# Patient Record
Sex: Female | Born: 1950 | Race: White | Hispanic: No | Marital: Married | Smoking: Never smoker
Health system: Southern US, Community
[De-identification: ages and names within clinical notes are randomized; demographics above are authoritative.]

## PROBLEM LIST (undated history)

## (undated) DIAGNOSIS — I10 Essential (primary) hypertension: Secondary | ICD-10-CM

## (undated) DIAGNOSIS — E785 Hyperlipidemia, unspecified: Secondary | ICD-10-CM

## (undated) HISTORY — DX: Hyperlipidemia, unspecified: E78.5

## (undated) HISTORY — DX: Essential (primary) hypertension: I10

## (undated) HISTORY — PX: TUBAL LIGATION: SHX77

---
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
## 2015-09-12 ENCOUNTER — Encounter: Payer: Self-pay | Admitting: Emergency Medicine

## 2015-09-12 ENCOUNTER — Emergency Department
Admission: EM | Admit: 2015-09-12 | Discharge: 2015-09-12 | Disposition: A | Discharge status: Home / Self Care | Payer: Self-pay | Source: Home / Self Care | Attending: Family Medicine | Admitting: Family Medicine

## 2015-09-12 DIAGNOSIS — I10 Essential (primary) hypertension: Secondary | ICD-10-CM

## 2015-09-12 DIAGNOSIS — R002 Palpitations: Secondary | ICD-10-CM

## 2015-09-12 DIAGNOSIS — M940 Chondrocostal junction syndrome [Tietze]: Secondary | ICD-10-CM

## 2015-09-12 NOTE — Discharge Instructions (Signed)
Apply ice pack for 20 to 30 minutes, 3 to 4 times daily  Continue until pain decreases.  May begin Ibuprofen 200mg , 4 tabs every 8 hours with food.   Continue carvedilol.  Monitor blood pressure and record on a calendar.  If cold symptoms develop, try the following: Take plain guaifenesin (1200mg  extended release tabs such as Mucinex) twice daily, with plenty of water, for cough and congestion. Get adequate rest.   May use Afrin nasal spray (or generic oxymetazoline) twice daily for about 5 days and then discontinue.  Also recommend using saline nasal spray several times daily and saline nasal irrigation (AYR is a common brand).  Use Flonase nasal spray each morning after using Afrin nasal spray and saline nasal irrigation. Try warm salt water gargles for sore throat.  Stop all antihistamines for now, and other non-prescription cough/cold preparations. May take Delsym Cough Suppressant at bedtime for nighttime cough.  Follow-up with family doctor if not improving about10 days.    Costochondritis Costochondritis, sometimes called Tietze syndrome, is a swelling and irritation (inflammation) of the tissue (cartilage) that connects your ribs with your breastbone (sternum). It causes pain in the chest and rib area. Costochondritis usually goes away on its own over time. It can take up to 6 weeks or longer to get better, especially if you are unable to limit your activities. CAUSES  Some cases of costochondritis have no known cause. Possible causes include:  Injury (trauma).  Exercise or activity such as lifting.  Severe coughing. SIGNS AND SYMPTOMS  Pain and tenderness in the chest and rib area.  Pain that gets worse when coughing or taking deep breaths.  Pain that gets worse with specific movements. DIAGNOSIS  Your health care provider will do a physical exam and ask about your symptoms. Chest X-rays or other tests may be done to rule out other problems. TREATMENT  Costochondritis  usually goes away on its own over time. Your health care provider may prescribe medicine to help relieve pain. HOME CARE INSTRUCTIONS   Avoid exhausting physical activity. Try not to strain your ribs during normal activity. This would include any activities using chest, abdominal, and side muscles, especially if heavy weights are used.  Apply ice to the affected area for the first 2 days after the pain begins.  Put ice in a plastic bag.  Place a towel between your skin and the bag.  Leave the ice on for 20 minutes, 2-3 times a day.  Only take over-the-counter or prescription medicines as directed by your health care provider. SEEK MEDICAL CARE IF:  You have redness or swelling at the rib joints. These are signs of infection.  Your pain does not go away despite rest or medicine. SEEK IMMEDIATE MEDICAL CARE IF:   Your pain increases or you are very uncomfortable.  You have shortness of breath or difficulty breathing.  You cough up blood.  You have worse chest pains, sweating, or vomiting.  You have a fever or persistent symptoms for more than 2-3 days.  You have a fever and your symptoms suddenly get worse. MAKE SURE YOU:   Understand these instructions.  Will watch your condition.  Will get help right away if you are not doing well or get worse.   This information is not intended to replace advice given to you by your health care provider. Make sure you discuss any questions you have with your health care provider.   Document Released: 04/07/2005 Document Revised: 04/18/2013 Document Reviewed: 01/30/2013 Elsevier  Interactive Patient Education ©2016 Elsevier Inc. ° °

## 2015-09-12 NOTE — ED Notes (Signed)
Chest tightness started this morning, no pain, has had palpitations for years was evaluated earlier in the week at another Urgent Care and prescribed Carvedilol

## 2015-09-12 NOTE — ED Provider Notes (Signed)
CSN: [id number]     Arrival date & time 09/12/15  [id number] History   First MD Initiated Contact with Patient 09/12/15 0913     Chief Complaint  Patient presents with  . Chest Pain      HPI Comments: Two to three days ago patient had not felt well and found her blood pressure to be elevated.  She has also had palpitations in the past and noted her pulse to be elevated.  She visited a local [facility] urgent care center where she was found to have an elevated blood pressure of 164/92 and pulse of 102.  Her EKG showed an incomplete right bundle branch block without other acute changes.  She was started on carvedilol 12.5mg  BID.  The patient complains of onset of anterior chest tightness this morning, and was very concerned because she did not quite understand what a RBBB is.  She notes that her blood pressure improved after starting carvedilol. Patient reports that she has developed a cough during the past two days, with mild sore throat, nasal congestion, fatigue, and chills. 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 Family history:  Father died of MI age 89.  Mother with hypertension and ASCVD (stent placed).  The history is provided by the patient.    For Past Medical History, Surgical History, and Family History, see HPI Comments. Social History  Substance Use Topics  . Smoking status: Never Smoker   . Smokeless tobacco: None  . Alcohol Use: No   OB History    No data available     Review of Systems + sore throat + cough No pleuritic pain, but has pain over substernal area No wheezing + nasal congestion ? post-nasal drainage No sinus pain/pressure No itchy/red eyes No earache No hemoptysis No SOB No fever, + chills No nausea No vomiting No abdominal pain No diarrhea No urinary symptoms No skin rash + fatigue No myalgias No headache Used OTC meds without relief  Allergies  Review of patient's allergies indicates no known allergies.  Home  Medications   Prior to Admission medications   Medication Sig Start Date End Date Taking? Authorizing Provider  aspirin 81 MG tablet Take 81 mg by mouth daily.   Yes Historical Provider, MD  carvedilol (COREG) 12.5 MG tablet Take 12.5 mg by mouth 2 (two) times daily with a meal.   Yes Historical Provider, MD   Meds Ordered and Administered this Visit  Medications - No data to display  BP 141/80 mmHg  Pulse 74  Temp(Src) 97.7 F (36.5 C) (Oral)  Ht 5\' 1"  (1.549 m)  Wt 133 lb (60.328 kg)  BMI 25.14 kg/m2  SpO2 100% No data found.   Physical Exam Nursing notes and Vital Signs reviewed. Appearance:  Patient appears stated age, and in no acute distress Eyes:  Pupils are equal, round, and reactive to light and accomodation.  Extraocular movement is intact.  Conjunctivae are not inflamed  Ears:  Canals normal.  Tympanic membranes normal.  Nose:  Mildly congested turbinates.  No sinus tenderness.   Pharynx:  Normal Neck:  Supple.  Tender enlarged posterior nodes are palpated bilaterally  Lungs:  Clear to auscultation.  Breath sounds are equal.  Moving air well. Chest:  Distinct tenderness to palpation over the mid-sternum; palpation there recreates her chest pain Heart:  Regular rate and rhythm without murmurs, rubs, or gallops.  Abdomen:  Nontender without masses or hepatosplenomegaly.  Bowel sounds are present.  No CVA  or flank tenderness.  Extremities:  No edema.  Skin:  No rash present.    ED Course  Procedures none    Labs Reviewed -   EKG: Rate:  70 BPM PR:  0.12 msec QT:  0.36 msec QTcH:   msec QRSD:  0.10 msec QRS axis:  +10  degrees Interpretation:   Normal sinus rhythm, incomplete RBBB.  No acute changes. Review of EKD from [facility] dated 09/10/15 reveals no significant changes from today's tracing.    MDM   1. Costochondritis; suspect a consequence of early viral URI  2. Palpitations   3. Essential hypertension, benign    Reassurance Apply ice pack to sternum  for 20 to 30 minutes, 3 to 4 times daily  Continue until pain decreases.  May begin Ibuprofen 200mg , 4 tabs every 8 hours with food.   Continue carvedilol.  Monitor blood pressure and record on a calendar.  If cold symptoms develop, try the following: Take plain guaifenesin (1200mg  extended release tabs such as Mucinex) twice daily, with plenty of water, for cough and congestion. Get adequate rest.   May use Afrin nasal spray (or generic oxymetazoline) twice daily for about 5 days and then discontinue.  Also recommend using saline nasal spray several times daily and saline nasal irrigation (AYR is a common brand).  Use Flonase nasal spray each morning after using Afrin nasal spray and saline nasal irrigation. Try warm salt water gargles for sore throat.  Stop all antihistamines for now, and other non-prescription cough/cold preparations. May take Delsym Cough Suppressant at bedtime for nighttime cough.  Follow-up with family doctor if not improving about10 days.   Recommend follow-up with PCP to establish care and evaluate/treat palpitations and hypertension     Kandra Nicolas, MD 09/15/15 2214

## 2015-09-24 ENCOUNTER — Encounter: Payer: Self-pay | Admitting: Physician Assistant

## 2015-09-24 ENCOUNTER — Ambulatory Visit (INDEPENDENT_AMBULATORY_CARE_PROVIDER_SITE_OTHER): Payer: Self-pay | Admitting: Physician Assistant

## 2015-09-24 VITALS — BP 152/98 | HR 89 | Ht 61.5 in | Wt 132.0 lb

## 2015-09-24 DIAGNOSIS — I4510 Unspecified right bundle-branch block: Secondary | ICD-10-CM | POA: Insufficient documentation

## 2015-09-24 DIAGNOSIS — Z13220 Encounter for screening for lipoid disorders: Secondary | ICD-10-CM

## 2015-09-24 DIAGNOSIS — I10 Essential (primary) hypertension: Secondary | ICD-10-CM | POA: Insufficient documentation

## 2015-09-24 DIAGNOSIS — Z131 Encounter for screening for diabetes mellitus: Secondary | ICD-10-CM

## 2015-09-24 DIAGNOSIS — R002 Palpitations: Secondary | ICD-10-CM | POA: Insufficient documentation

## 2015-09-24 DIAGNOSIS — C434 Malignant melanoma of scalp and neck: Secondary | ICD-10-CM | POA: Insufficient documentation

## 2015-09-24 DIAGNOSIS — I450 Right fascicular block: Secondary | ICD-10-CM

## 2015-09-24 LAB — COMPLETE METABOLIC PANEL WITH GFR
ALK PHOS: 73 U/L (ref 33–130)
ALT: 17 U/L (ref 6–29)
AST: 17 U/L (ref 10–35)
Albumin: 4.6 g/dL (ref 3.6–5.1)
BILIRUBIN TOTAL: 0.6 mg/dL (ref 0.2–1.2)
BUN: 7 mg/dL (ref 7–25)
CALCIUM: 9.8 mg/dL (ref 8.6–10.4)
CHLORIDE: 100 mmol/L (ref 98–110)
CO2: 26 mmol/L (ref 20–31)
CREATININE: 0.84 mg/dL (ref 0.50–0.99)
GFR, EST AFRICAN AMERICAN: 86 mL/min (ref >=60)
GFR, Est Non African American: 74 mL/min (ref >=60)
Glucose, Bld: 84 mg/dL (ref 65–99)
Potassium: 3.8 mmol/L (ref 3.5–5.3)
Sodium: 138 mmol/L (ref 135–146)
Total Protein: 6.9 g/dL (ref 6.1–8.1)

## 2015-09-24 LAB — LIPID PANEL
CHOLESTEROL: 214 mg/dL — AB (ref 125–200)
HDL: 54 mg/dL (ref >=46)
LDL Cholesterol: 144 mg/dL — ABNORMAL HIGH (ref <130)
TRIGLYCERIDES: 78 mg/dL (ref <150)
Total CHOL/HDL Ratio: 4 Ratio (ref <=5.0)
VLDL: 16 mg/dL (ref <30)

## 2015-09-24 LAB — CBC WITH DIFFERENTIAL/PLATELET
BASOS PCT: 1 % (ref 0–1)
Basophils Absolute: 0.1 10*3/uL (ref 0.0–0.1)
EOS ABS: 0.1 10*3/uL (ref 0.0–0.7)
EOS PCT: 1 % (ref 0–5)
HCT: 42.9 % (ref 36.0–46.0)
Hemoglobin: 15.5 g/dL — ABNORMAL HIGH (ref 12.0–15.0)
Lymphocytes Relative: 34 % (ref 12–46)
Lymphs Abs: 2.1 10*3/uL (ref 0.7–4.0)
MCH: 32.2 pg (ref 26.0–34.0)
MCHC: 36.1 g/dL — AB (ref 30.0–36.0)
MCV: 89.2 fL (ref 78.0–100.0)
MONO ABS: 0.6 10*3/uL (ref 0.1–1.0)
MPV: 11.8 fL (ref 8.6–12.4)
Monocytes Relative: 9 % (ref 3–12)
NEUTROS ABS: 3.5 10*3/uL (ref 1.7–7.7)
Neutrophils Relative %: 55 % (ref 43–77)
Platelets: 244 10*3/uL (ref 150–400)
RBC: 4.81 MIL/uL (ref 3.87–5.11)
RDW: 13.5 % (ref 11.5–15.5)
WBC: 6.3 10*3/uL (ref 4.0–10.5)

## 2015-09-24 LAB — TSH: TSH: 1.55 m[IU]/L

## 2015-09-24 LAB — VITAMIN B12: Vitamin B-12: 1564 pg/mL — ABNORMAL HIGH (ref 200–1100)

## 2015-09-24 LAB — FERRITIN: FERRITIN: 108 ng/mL (ref 20–288)

## 2015-09-24 MED ORDER — NEBIVOLOL HCL 5 MG PO TABS: 5.0000 mg | ORAL_TABLET | Freq: Every day | ORAL | Status: DC

## 2015-09-24 NOTE — Progress Notes (Signed)
   Subjective:    Patient ID: Theresa Herrera, female    DOB: 09-Dec-1950, 65 y.o.   MRN: [id number]  HPI Patient is 65 year old female who presents to the clinic to establish care.  .. Active Ambulatory Problems    Diagnosis Date Noted  . 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 Essential hypertension, benign 09/24/2015  . Palpitations 09/24/2015  . Incomplete RBBB 09/24/2015   Resolved Ambulatory Problems    Diagnosis Date Noted  . No Resolved Ambulatory Problems   Past Medical History  Diagnosis Date  . Hyperlipidemia   . Hypertension    .Marland Kitchen Family History  Problem Relation Age of Onset  . Cancer Mother   . Hyperlipidemia Mother   . Hypertension Mother   . Hypertension Father   . Hyperlipidemia Father   . Heart attack Father    .Marland Kitchen Social History   Social History  . Marital Status: Married    Spouse Name: N/A  . Number of Children: N/A  . Years of Education: N/A   Occupational History  . Not on file.   Social History Main Topics  . Smoking status: Never Smoker   . Smokeless tobacco: Not on file  . Alcohol Use: No  . Drug Use: Not on file  . Sexual Activity: No   Other Topics Concern  . Not on file   Social History Narrative   patient's main concern today is her palpitations and blood pressure. She has been to 2 urgent cares in the last few months due to chest pain. She was diagnosed with incomplete right bundle branch block and costochondritis. She does admit she's feeling much better. Her palpitations have improved significantly. She had been taking Coreg twice a day. She does not like the way this makes her feel. She feels very "drugged". She did stop it 2 days ago. She has not had any fasting labs done in a while.  Patient would like a referral for dermatology. She had melanoma of her neck in 2006. She gets yearly skin checks. She would like to go with someone to [facility].  Review of Systems  All other systems reviewed and are negative.      Objective:    Physical Exam  Constitutional: She appears well-developed and well-nourished.  HENT:  Head: Normocephalic and atraumatic.  Neck: Normal range of motion. Neck supple. No thyromegaly present.  Cardiovascular: Normal rate, regular rhythm and normal heart sounds.   Pulmonary/Chest: Effort normal and breath sounds normal. She has no wheezes.  Skin: Skin is dry.  Psychiatric: She has a normal mood and affect. Her behavior is normal.          Assessment & Plan:  Palpitations/HTN- palpitations improving. Elevated BP could be due to rebound hypertension from stopping BB.start ASA 81mg . Stop carvediol. Start bystolic 5mg  daily. Follow up in 1 month. Labs to check TSH, CBC, ferritin for causes of palpitations.   Fasting lipid and glucose ordered.    RBBB- reassured patient. Will continue to monitor and asses risk factors.   Melanoma on neck, resolved-  Removed 06/24/2005. will make referral for dermatology for skin checks.

## 2015-09-24 NOTE — Patient Instructions (Addendum)
Start ASA 81mg  daily.   140/90 goal.   caltrate-D  DASH Eating Plan DASH stands for "Dietary Approaches to Stop Hypertension." The DASH eating plan is a healthy eating plan that has been shown to reduce high blood pressure (hypertension). Additional health benefits may include reducing the risk of type 2 diabetes mellitus, heart disease, and stroke. The DASH eating plan may also help with weight loss. WHAT DO I NEED TO KNOW ABOUT THE DASH EATING PLAN? For the DASH eating plan, you will follow these general guidelines:  Choose foods with a percent daily value for sodium of less than 5% (as listed on the food label).  Use salt-free seasonings or herbs instead of table salt or sea salt.  Check with your health care provider or pharmacist before using salt substitutes.  Eat lower-sodium products, often labeled as "lower sodium" or "no salt added."  Eat fresh foods.  Eat more vegetables, fruits, and low-fat dairy products.  Choose whole grains. Look for the word "whole" as the first word in the ingredient list.  Choose fish and skinless chicken or [place] more often than red meat. Limit fish, poultry, and meat to 6 oz (170 g) each day.  Limit sweets, desserts, sugars, and sugary drinks.  Choose heart-healthy fats.  Limit cheese to 1 oz (28 g) per day.  Eat more home-cooked food and less restaurant, buffet, and fast food.  Limit fried foods.  Cook foods using methods other than frying.  Limit canned vegetables. If you do use them, rinse them well to decrease the sodium.  When eating at a restaurant, ask that your food be prepared with less salt, or no salt if possible. WHAT FOODS CAN I EAT? Seek help from a dietitian for individual calorie needs. Grains Whole grain or whole wheat bread. Brown rice. Whole grain or whole wheat pasta. Quinoa, bulgur, and whole grain cereals. Low-sodium cereals. Corn or whole wheat flour tortillas. Whole grain cornbread. Whole grain crackers.  Low-sodium crackers. Vegetables Fresh or frozen vegetables (raw, steamed, roasted, or grilled). Low-sodium or reduced-sodium tomato and vegetable juices. Low-sodium or reduced-sodium tomato sauce and paste. Low-sodium or reduced-sodium canned vegetables.  Fruits All fresh, canned (in natural juice), or frozen fruits. Meat and Other Protein Products Ground beef (85% or leaner), grass-fed beef, or beef trimmed of fat. Skinless chicken or [place]. Ground chicken or [place]. Pork trimmed of fat. All fish and seafood. Eggs. Dried beans, peas, or lentils. Unsalted nuts and seeds. Unsalted canned beans. Dairy Low-fat dairy products, such as skim or 1% milk, 2% or reduced-fat cheeses, low-fat ricotta or cottage cheese, or plain low-fat yogurt. Low-sodium or reduced-sodium cheeses. Fats and Oils Tub margarines without trans fats. Light or reduced-fat mayonnaise and salad dressings (reduced sodium). Avocado. Safflower, olive, or canola oils. Natural peanut or almond butter. Other Unsalted popcorn and pretzels. The items listed above may not be a complete list of recommended foods or beverages. Contact your dietitian for more options. WHAT FOODS ARE NOT RECOMMENDED? Grains White bread. White pasta. White rice. Refined cornbread. Bagels and croissants. Crackers that contain trans fat. Vegetables Creamed or fried vegetables. Vegetables in a cheese sauce. Regular canned vegetables. Regular canned tomato sauce and paste. Regular tomato and vegetable juices. Fruits Dried fruits. Canned fruit in light or heavy syrup. Fruit juice. Meat and Other Protein Products Fatty cuts of meat. Ribs, chicken wings, bacon, sausage, bologna, salami, chitterlings, fatback, hot dogs, bratwurst, and packaged luncheon meats. Salted nuts and seeds. Canned beans with salt. Dairy Whole or  2% milk, cream, half-and-half, and cream cheese. Whole-fat or sweetened yogurt. Full-fat cheeses or blue cheese. Nondairy creamers and whipped  toppings. Processed cheese, cheese spreads, or cheese curds. Condiments Onion and garlic salt, seasoned salt, table salt, and sea salt. Canned and packaged gravies. Worcestershire sauce. Tartar sauce. Barbecue sauce. Teriyaki sauce. Soy sauce, including reduced sodium. Steak sauce. Fish sauce. Oyster sauce. Cocktail sauce. Horseradish. Ketchup and mustard. Meat flavorings and tenderizers. Bouillon cubes. Hot sauce. Tabasco sauce. Marinades. Taco seasonings. Relishes. Fats and Oils Butter, stick margarine, lard, shortening, ghee, and bacon fat. Coconut, palm kernel, or palm oils. Regular salad dressings. Other Pickles and olives. Salted popcorn and pretzels. The items listed above may not be a complete list of foods and beverages to avoid. Contact your dietitian for more information. WHERE CAN I FIND MORE INFORMATION? National Heart, Lung, and Blood Institute: travelstabloid.com   This information is not intended to replace advice given to you by your health care provider. Make sure you discuss any questions you have with your health care provider.   Document Released: 06/17/2011 Document Revised: 07/19/2014 Document Reviewed: 05/02/2013 Elsevier Interactive Patient Education [employer].

## 2015-09-25 ENCOUNTER — Telehealth: Payer: Self-pay | Admitting: *Deleted

## 2015-09-25 LAB — VITAMIN D 25 HYDROXY (VIT D DEFICIENCY, FRACTURES): VIT D 25 HYDROXY: 35 ng/mL (ref 30–100)

## 2015-09-25 NOTE — Telephone Encounter (Signed)
Pt left vm today stating that the Bystolic is [id number] for [id number] tabs.  She wants to know if there is something else or should she continue the carvedilol? Also, she said that she will keep her current dermatologist.

## 2015-09-26 ENCOUNTER — Encounter: Payer: Self-pay | Admitting: Physician Assistant

## 2015-09-26 DIAGNOSIS — R748 Abnormal levels of other serum enzymes: Secondary | ICD-10-CM | POA: Insufficient documentation

## 2015-09-26 DIAGNOSIS — E785 Hyperlipidemia, unspecified: Secondary | ICD-10-CM | POA: Insufficient documentation

## 2015-09-26 NOTE — Telephone Encounter (Signed)
Look for coupon for bystolic call pharmacy and see if will work with her plan. If not let me know.   Cancel derm referral per patient.

## 2015-09-29 ENCOUNTER — Other Ambulatory Visit: Payer: Self-pay | Admitting: Physician Assistant

## 2015-09-29 MED ORDER — METOPROLOL SUCCINATE ER 50 MG PO TB24: 50.0000 mg | ORAL_TABLET | Freq: Every day | ORAL | Status: DC

## 2015-09-29 NOTE — Telephone Encounter (Signed)
There are no Bystolic coupon cards.

## 2015-09-29 NOTE — Telephone Encounter (Signed)
I sent over another beta blocker metoprolol 50mg  daily. It should be cheaper. Once get this medication will stop carvedilol. i would not stop until make transition to new medication.

## 2015-09-29 NOTE — Telephone Encounter (Signed)
LMOM notifying pt of new rx & instructions.

## 2015-10-21 ENCOUNTER — Ambulatory Visit: Payer: Self-pay | Admitting: Physician Assistant

## 2015-11-25 ENCOUNTER — Telehealth: Payer: Self-pay | Admitting: *Deleted

## 2015-11-25 DIAGNOSIS — Z1159 Encounter for screening for other viral diseases: Secondary | ICD-10-CM

## 2015-11-25 NOTE — Telephone Encounter (Signed)
Pt called & requested a Hep C screen.  Ordered & faxed to lab.

## 2015-11-26 LAB — HEPATITIS C ANTIBODY: HCV AB: NEGATIVE

## 2017-07-26 DIAGNOSIS — R69 Illness, unspecified: Secondary | ICD-10-CM | POA: Diagnosis not present

## 2017-08-17 DIAGNOSIS — R69 Illness, unspecified: Secondary | ICD-10-CM | POA: Diagnosis not present

## 2017-09-14 DIAGNOSIS — Z7982 Long term (current) use of aspirin: Secondary | ICD-10-CM | POA: Diagnosis not present

## 2017-09-14 DIAGNOSIS — Z8249 Family history of ischemic heart disease and other diseases of the circulatory system: Secondary | ICD-10-CM | POA: Diagnosis not present

## 2017-09-14 DIAGNOSIS — Z809 Family history of malignant neoplasm, unspecified: Secondary | ICD-10-CM | POA: Diagnosis not present

## 2017-09-14 DIAGNOSIS — Z85828 Personal history of other malignant neoplasm of skin: Secondary | ICD-10-CM | POA: Diagnosis not present

## 2017-09-14 DIAGNOSIS — M8580 Other specified disorders of bone density and structure, unspecified site: Secondary | ICD-10-CM | POA: Diagnosis not present

## 2017-09-14 DIAGNOSIS — I10 Essential (primary) hypertension: Secondary | ICD-10-CM | POA: Diagnosis not present

## 2017-09-14 DIAGNOSIS — Z87891 Personal history of nicotine dependence: Secondary | ICD-10-CM | POA: Diagnosis not present

## 2017-09-14 DIAGNOSIS — Z85820 Personal history of malignant melanoma of skin: Secondary | ICD-10-CM | POA: Diagnosis not present

## 2017-09-14 DIAGNOSIS — Z825 Family history of asthma and other chronic lower respiratory diseases: Secondary | ICD-10-CM | POA: Diagnosis not present

## 2017-10-04 ENCOUNTER — Encounter: Payer: Self-pay | Admitting: Physician Assistant

## 2017-10-04 ENCOUNTER — Ambulatory Visit (INDEPENDENT_AMBULATORY_CARE_PROVIDER_SITE_OTHER): Payer: Medicare HMO | Admitting: Physician Assistant

## 2017-10-04 VITALS — BP 164/74 | HR 96 | Ht 61.5 in | Wt 145.0 lb

## 2017-10-04 DIAGNOSIS — R748 Abnormal levels of other serum enzymes: Secondary | ICD-10-CM | POA: Diagnosis not present

## 2017-10-04 DIAGNOSIS — I10 Essential (primary) hypertension: Secondary | ICD-10-CM | POA: Diagnosis not present

## 2017-10-04 DIAGNOSIS — E782 Mixed hyperlipidemia: Secondary | ICD-10-CM | POA: Diagnosis not present

## 2017-10-04 DIAGNOSIS — C434 Malignant melanoma of scalp and neck: Secondary | ICD-10-CM | POA: Diagnosis not present

## 2017-10-04 DIAGNOSIS — Z23 Encounter for immunization: Secondary | ICD-10-CM

## 2017-10-04 DIAGNOSIS — Z1231 Encounter for screening mammogram for malignant neoplasm of breast: Secondary | ICD-10-CM | POA: Diagnosis not present

## 2017-10-04 DIAGNOSIS — C439 Malignant melanoma of skin, unspecified: Secondary | ICD-10-CM | POA: Insufficient documentation

## 2017-10-04 MED ORDER — METOPROLOL SUCCINATE ER 50 MG PO TB24: 50.0000 mg | ORAL_TABLET | Freq: Every day | ORAL | 1 refills | Status: DC

## 2017-10-04 NOTE — Progress Notes (Signed)
Subjective:    Patient ID: Theresa Herrera, female    DOB: Sep 06, 1950, 67 y.o.   MRN: [id number]  HPI Theresa Herrera is a 67 year old female who presents today for blood pressure medication refill. She states that she has been doing well at home and has been taking her metoprolol. She has been taking her pressures and they have been ranging ~130/70. She denies any visual changes, headaches, chest pain. She states that her pressures are usually elevated in the office. She is agreeable to a TDaP and mammogram at this time but wants to hold off on Prevnar at this time. She otherwise has no questions or complaints at this time.  .. Active Ambulatory Problems    Diagnosis Date Noted  . 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 Essential hypertension, benign 09/24/2015  . Palpitations 09/24/2015  . 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 Elevated vitamin B12 level 09/26/2015  . Hyperlipidemia 09/26/2015  . Melanoma of skin ([place]) 10/04/2017   Resolved Ambulatory Problems    Diagnosis Date Noted  . No Resolved Ambulatory Problems   Past Medical History:  Diagnosis Date  . Hyperlipidemia   . Hypertension    .Marland Kitchen Family History  Problem Relation Age of Onset  . Cancer Mother   . Hyperlipidemia Mother   . Hypertension Mother   . Hypertension Father   . Hyperlipidemia Father   . Heart attack Father       Review of Systems  Constitutional: Negative for activity change and fatigue.  Eyes: Negative for visual disturbance.  Respiratory: Negative for chest tightness and shortness of breath.   Cardiovascular: Negative for chest pain and palpitations.  Neurological: Negative for dizziness, light-headedness and headaches.       Objective:   Physical Exam  Constitutional: She is oriented to person, place, and time. She appears well-developed and well-nourished.  HENT:  Head: Normocephalic and atraumatic.  Right Ear: External ear normal.  Left Ear: External ear normal.  Eyes: Conjunctivae are normal.   Cardiovascular: Normal rate, regular rhythm and normal heart sounds.  Pulmonary/Chest: Effort normal and breath sounds normal.  Neurological: She is alert and oriented to person, place, and time.  Skin: Skin is warm and dry.  Psychiatric: She has a normal mood and affect. Her behavior is normal.  Nursing note and vitals reviewed.         Assessment & Plan:  Marland KitchenMarland KitchenDiagnoses and all orders for this visit:  Essential hypertension, benign -     COMPLETE METABOLIC PANEL WITH GFR -     metoprolol succinate (TOPROL-XL) 50 MG 24 hr tablet; Take 1 tablet (50 mg total) by mouth daily. Take with or immediately following a meal.  Melanoma of neck ([facility])  Elevated vitamin B12 level -     B12  Mixed hyperlipidemia -     Lipid Panel w/reflex Direct LDL  Visit for screening mammogram -     MM SCREENING BREAST TOMO BILATERAL  Need for Tdap vaccination -     Tdap vaccine greater than or equal to 7yo IM   .Marland Kitchen Depression screen [facility] 2/9 10/04/2017  Decreased Interest 0  Down, Depressed, Hopeless 1  PHQ - 2 Score 1  Altered sleeping 0  Tired, decreased energy 1  Change in appetite 1  Feeling bad or failure about yourself  1  Trouble concentrating 1  Moving slowly or fidgety/restless 1  Suicidal thoughts 0  PHQ-9 Score 6  Difficult doing work/chores Not difficult at all   .Marland Kitchen GAD 7 :  Generalized Anxiety Score 10/04/2017  Nervous, Anxious, on Edge 1  Control/stop worrying 1  Worry too much - different things 1  Trouble relaxing 0  Restless 0  Easily annoyed or irritable 0  Afraid - awful might happen 1  Total GAD 7 Score 4  Anxiety Difficulty Not difficult at all    Fasting labs ordered.  Encouraged to get mammogram.  Tdap given.  Declined all other health prevention. Explained risk vs benefit.   BP medication refilled for 6 months. BP reports are great. In house BP not. Continue to check BP at home.   Follow up in 6 months.

## 2017-10-05 DIAGNOSIS — R748 Abnormal levels of other serum enzymes: Secondary | ICD-10-CM | POA: Diagnosis not present

## 2017-10-05 DIAGNOSIS — I10 Essential (primary) hypertension: Secondary | ICD-10-CM | POA: Diagnosis not present

## 2017-10-05 DIAGNOSIS — E782 Mixed hyperlipidemia: Secondary | ICD-10-CM | POA: Diagnosis not present

## 2017-10-06 LAB — COMPLETE METABOLIC PANEL WITH GFR
AG RATIO: 2.4 (calc) (ref 1.0–2.5)
ALKALINE PHOSPHATASE (APISO): 83 U/L (ref 33–130)
ALT: 23 U/L (ref 6–29)
AST: 16 U/L (ref 10–35)
Albumin: 4.6 g/dL (ref 3.6–5.1)
BILIRUBIN TOTAL: 0.5 mg/dL (ref 0.2–1.2)
BUN: 18 mg/dL (ref 7–25)
CHLORIDE: 102 mmol/L (ref 98–110)
CO2: 29 mmol/L (ref 20–32)
Calcium: 9.5 mg/dL (ref 8.6–10.4)
Creat: 0.81 mg/dL (ref 0.50–0.99)
GFR, EST AFRICAN AMERICAN: 88 mL/min/{1.73_m2} (ref >=60)
GFR, Est Non African American: 76 mL/min/{1.73_m2} (ref >=60)
GLUCOSE: 93 mg/dL (ref 65–99)
Globulin: 1.9 g/dL (calc) (ref 1.9–3.7)
Potassium: 4.1 mmol/L (ref 3.5–5.3)
Sodium: 139 mmol/L (ref 135–146)
TOTAL PROTEIN: 6.5 g/dL (ref 6.1–8.1)

## 2017-10-06 LAB — LIPID PANEL W/REFLEX DIRECT LDL
CHOL/HDL RATIO: 4.3 (calc) (ref <5.0)
Cholesterol: 247 mg/dL — ABNORMAL HIGH (ref <200)
HDL: 58 mg/dL (ref >50)
LDL CHOLESTEROL (CALC): 167 mg/dL — AB
NON-HDL CHOLESTEROL (CALC): 189 mg/dL — AB (ref <130)
TRIGLYCERIDES: 106 mg/dL (ref <150)

## 2017-10-06 LAB — VITAMIN B12: Vitamin B-12: >2000 pg/mL — ABNORMAL HIGH (ref 200–1100)

## 2017-10-07 NOTE — Progress Notes (Signed)
Call pt: 10 year cardiac risk increased to 13.3 percent and suggest to start with a statin to lower LDL.  b12 is too elevated. Are you taking supplements? Cut in half.  Kidney, liver, glucose look great.

## 2017-10-12 ENCOUNTER — Ambulatory Visit (INDEPENDENT_AMBULATORY_CARE_PROVIDER_SITE_OTHER): Payer: Medicare HMO

## 2017-10-12 DIAGNOSIS — Z1231 Encounter for screening mammogram for malignant neoplasm of breast: Secondary | ICD-10-CM

## 2017-10-12 NOTE — Progress Notes (Signed)
Call pt: normal mammogram. Follow up in 1 year.

## 2017-10-21 ENCOUNTER — Telehealth: Payer: Self-pay | Admitting: Physician Assistant

## 2017-10-21 NOTE — Telephone Encounter (Signed)
I got your letter. I am ok with you working on diet. I just wanted to make you aware of your overall risk. Lets do a 4 month recheck!

## 2017-10-21 NOTE — Telephone Encounter (Signed)
Left message advising patient of recommendations.  °

## 2017-12-07 ENCOUNTER — Ambulatory Visit (INDEPENDENT_AMBULATORY_CARE_PROVIDER_SITE_OTHER): Payer: Medicare HMO | Admitting: Physician Assistant

## 2017-12-07 ENCOUNTER — Encounter: Payer: Self-pay | Admitting: Physician Assistant

## 2017-12-07 VITALS — BP 177/54 | HR 88 | Ht 61.5 in | Wt 142.0 lb

## 2017-12-07 DIAGNOSIS — Z85820 Personal history of malignant melanoma of skin: Secondary | ICD-10-CM | POA: Diagnosis not present

## 2017-12-07 DIAGNOSIS — R748 Abnormal levels of other serum enzymes: Secondary | ICD-10-CM

## 2017-12-07 DIAGNOSIS — L989 Disorder of the skin and subcutaneous tissue, unspecified: Secondary | ICD-10-CM | POA: Diagnosis not present

## 2017-12-07 DIAGNOSIS — I10 Essential (primary) hypertension: Secondary | ICD-10-CM | POA: Diagnosis not present

## 2017-12-07 DIAGNOSIS — Z8349 Family history of other endocrine, nutritional and metabolic diseases: Secondary | ICD-10-CM | POA: Diagnosis not present

## 2017-12-07 DIAGNOSIS — E782 Mixed hyperlipidemia: Secondary | ICD-10-CM | POA: Diagnosis not present

## 2017-12-07 MED ORDER — TRIAMCINOLONE ACETONIDE 0.1 % EX CREA: 1.0000 "application " | TOPICAL_CREAM | Freq: Two times a day (BID) | CUTANEOUS | 1 refills | Status: DC

## 2017-12-07 NOTE — Patient Instructions (Addendum)
Red yeast rice 1200mg  twice a day. Recheck cholesterol in 6 months.  Fish oil 4000mg .

## 2017-12-07 NOTE — Progress Notes (Signed)
   Subjective:    Patient ID: Theresa Herrera, female    DOB: 24-Feb-1951, 67 y.o.   MRN: [id number]  HPI  Pt is a 67 yo female with HTN, hyperlipidemia, melanoma who presents to the clinic to discussion new skin lesion. There is a now red bump on left anterior upper neck that started looking like a pimple into a flat scaly lesion. She is concerned due to her skin cancer hx. She has put one week on topical antibiotic. It does seem to be getting some better.    Hyperlipidemia- she desires to try on her own. Discussed plans. She agrees to start red yeast rice and fish oil. She has started keeping to a more plant diet.    .. Active Ambulatory Problems    Diagnosis Date Noted  . 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 Essential hypertension, benign 09/24/2015  . Palpitations 09/24/2015  . 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 Elevated vitamin B12 level 09/26/2015  . Hyperlipidemia 09/26/2015  . Melanoma of skin ([place]) 10/04/2017   Resolved Ambulatory Problems    Diagnosis Date Noted  . No Resolved Ambulatory Problems   Past Medical History:  Diagnosis Date  . Hyperlipidemia   . Hypertension        Review of Systems    see HPI.  Objective:   Physical Exam  Constitutional: She is oriented to person, place, and time. She appears well-developed and well-nourished.  HENT:  Head: Normocephalic and atraumatic.  Cardiovascular: Normal rate and regular rhythm.  Pulmonary/Chest: Effort normal and breath sounds normal.  Neurological: She is alert and oriented to person, place, and time.  Skin:  Left upper neck shoulder 6mm erythematous with fine scales/crusted over it.   Psychiatric: She has a normal mood and affect. Her behavior is normal.          Assessment & Plan:  Marland KitchenMarland KitchenDiagnoses and all orders for this visit:  Skin lesion -     triamcinolone cream (KENALOG) 0.1 %; Apply 1 application topically 2 (two) times daily. -     CBC with Differential/Platelet -     Ambulatory referral to  Dermatology  Family history of MTHFR deficiency -     MTHFR DNA Analysis  Elevated vitamin B12 level -     B12 and Folate Panel  Mixed hyperlipidemia  Essential hypertension, benign  History of melanoma -     Ambulatory referral to Dermatology   Due to timeline I suspect not BCC or SCC;however, due to patients hx and fair skin. Early BCC cannot be excluded. Referred to dermatology. She is interested in [place] [facility]. Cbc ordered. Try triamcinolone on it. I suspect lesion could be an abrasion that is taking a little time to heal.   Will test for MTHFR gene.   Discussed cholesterol. Declines medication. Will try red yeast rice and fish oil.

## 2017-12-09 ENCOUNTER — Other Ambulatory Visit: Payer: Self-pay | Admitting: Physician Assistant

## 2017-12-11 ENCOUNTER — Emergency Department
Admission: EM | Admit: 2017-12-11 | Discharge: 2017-12-11 | Disposition: A | Discharge status: Home / Self Care | Payer: Medicare HMO | Source: Home / Self Care | Attending: Family Medicine | Admitting: Family Medicine

## 2017-12-11 ENCOUNTER — Encounter: Payer: Self-pay | Admitting: Emergency Medicine

## 2017-12-11 ENCOUNTER — Encounter: Payer: Self-pay | Admitting: Physician Assistant

## 2017-12-11 DIAGNOSIS — R21 Rash and other nonspecific skin eruption: Secondary | ICD-10-CM | POA: Diagnosis not present

## 2017-12-11 DIAGNOSIS — R42 Dizziness and giddiness: Secondary | ICD-10-CM | POA: Diagnosis not present

## 2017-12-11 DIAGNOSIS — Z008 Encounter for other general examination: Secondary | ICD-10-CM | POA: Diagnosis not present

## 2017-12-11 DIAGNOSIS — R110 Nausea: Secondary | ICD-10-CM

## 2017-12-11 LAB — POCT CBC W AUTO DIFF (K'VILLE URGENT CARE)

## 2017-12-11 MED ORDER — DOXYCYCLINE HYCLATE 100 MG PO CAPS: 100.0000 mg | ORAL_CAPSULE | Freq: Two times a day (BID) | ORAL | 0 refills | Status: DC

## 2017-12-11 MED ORDER — ONDANSETRON 4 MG PO TBDP
4.0000 mg | ORAL_TABLET | Freq: Once | ORAL | Status: AC
  Administered 2017-12-11: 4 mg via ORAL

## 2017-12-11 MED ORDER — ONDANSETRON 4 MG PO TBDP: ORAL_TABLET | ORAL | 0 refills | Status: DC

## 2017-12-11 NOTE — ED Provider Notes (Signed)
[facility]    CSN: [id number] Arrival date & time: 12/11/17  1103     History   Chief Complaint Chief Complaint  Patient presents with  . Rash    HPI Theresa Herrera is a 67 y.o. female.   HPI  Theresa Herrera is a 67 y.o. female presenting to UC with c/o a rash at the base of her neck on the Left side over the clavicle for over 1 month.  She was seen by her PCP this past week for same and was prescribed triamcinolone cream and advised to mix with an antibiotic ointment. She has been doing this but denies any relief. She is concerned it may be getting worse.  She also reports red rashes in both her under arms but forgot to mention to her PCP the other day.  Rash is mildly itchy but not painful.  She also c/o mild dizziness that started last night and this morning. Dizziness started after taking an antihistamine she found in her cabinet.  She took 1 pill yesterday and 1 today.  Denies vomiting or diarrhea but has had some nausea. Denies fever or chills. No known tick bites but she does walk outside for exercise.   Pt does have a dermatologist but is in transition to going to a new clinic. Her next appointment is not until September but her PCP advised her she may need a referral to go sooner if rash is not improving.   BP elevated in triage. Hx of HTN. Pt notes she did not take her BP medication this morning due to feeling dizzy.   Past Medical History:  Diagnosis Date  . Hyperlipidemia   . Hypertension     Patient Active Problem List   Diagnosis Date Noted  . Melanoma of skin ([place]) 10/04/2017  . Elevated vitamin B12 level 09/26/2015  . Hyperlipidemia 09/26/2015  . 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 Essential hypertension, benign 09/24/2015  . Palpitations 09/24/2015  . 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 TONSILLECTOMY  1958  . TUBAL LIGATION      OB History   None      Home Medications    Prior to Admission  medications   Medication Sig Start Date End Date Taking? Authorizing Provider  AMBULATORY NON FORMULARY MEDICATION Vitamin D green drink    [provider]  aspirin 81 MG tablet Take 81 mg by mouth daily.    [provider]  calcium carbonate (OSCAL) 1500 (600 Ca) MG TABS tablet Take 600 mg by mouth 2 (two) times daily with a meal.    [provider]  doxycycline (VIBRAMYCIN) 100 MG capsule Take 1 capsule (100 mg total) by mouth 2 (two) times daily. One po bid x 7 days 12/11/17   Noe Gens, PA-C  metoprolol succinate (TOPROL-XL) 50 MG 24 hr tablet Take 1 tablet (50 mg total) by mouth daily. Take with or immediately following a meal. 10/04/17   Breeback, Jade L, PA-C  ondansetron (ZOFRAN ODT) 4 MG disintegrating tablet 4mg  ODT q4 hours prn nausea/vomit 12/11/17   Noe Gens, PA-C  triamcinolone cream (KENALOG) 0.1 % Apply 1 application topically 2 (two) times daily. 12/07/17   Donella Stade, PA-C    Family History Family History  Problem Relation Age of Onset  . Hypertension Father   . Hyperlipidemia Father   . Heart attack Father   . Cancer Mother   . Hyperlipidemia Mother   .  Hypertension Mother     Social History Social History   Tobacco Use  . Smoking status: Never Smoker  . Smokeless tobacco: Never Used  Substance Use Topics  . Alcohol use: No  . Drug use: Not on file     Allergies   Patient has no known allergies.   Review of Systems Review of Systems  Constitutional: Positive for fatigue. Negative for chills and fever.  Respiratory: Negative for shortness of breath and wheezing.   Cardiovascular: Negative for chest pain and palpitations.  Gastrointestinal: Positive for nausea. Negative for diarrhea and vomiting.  Musculoskeletal: Negative for arthralgias, joint swelling and myalgias.  Skin: Positive for rash. Negative for wound.  Neurological: Positive for dizziness. Negative for syncope, weakness, light-headedness and headaches.      Physical Exam Triage Vital Signs ED Triage Vitals  Enc Vitals Group     BP 12/11/17 1123 (!) 174/92     Pulse Rate 12/11/17 1123 91     Resp --      Temp 12/11/17 1123 98.2 F (36.8 C)     Temp Source 12/11/17 1123 Oral     SpO2 12/11/17 1123 99 %     Weight 12/11/17 1124 143 lb 8 oz (65.1 kg)     Height 12/11/17 1124 5' 1.5" (1.562 m)     Head Circumference --      Peak Flow --      Pain Score 12/11/17 1124 0     Pain Loc --      Pain Edu? --      Excl. in [place]? --    No data found.  Updated Vital Signs BP (!) 174/92 (BP Location: Left Arm)   Pulse 91   Temp 98.2 F (36.8 C) (Oral)   Ht 5' 1.5" (1.562 m)   Wt 143 lb 8 oz (65.1 kg)   SpO2 99%   BMI 26.68 kg/m   Visual Acuity Right Eye Distance:   Left Eye Distance:   Bilateral Distance:    Right Eye Near:   Left Eye Near:    Bilateral Near:     Physical Exam  Constitutional: She is oriented to person, place, and time. She appears well-developed and well-nourished. No distress.  HENT:  Head: Normocephalic and atraumatic.  Eyes: EOM are normal.  Neck: Normal range of motion.  Cardiovascular: Normal rate and regular rhythm.  Pulmonary/Chest: Effort normal and breath sounds normal. No respiratory distress.  Musculoskeletal: Normal range of motion.  Neurological: She is alert and oriented to person, place, and time.  Skin: Skin is warm and dry. Rash noted. She is not diaphoretic. There is erythema.     Left upper chest over clavicle: 1cm erythematous raised near circular/target type lesion. Non-tender.   Bilateral axilla: erythematous flat macular type rash. No induration or fluctuance. Non-tender. No bleeding or discharge.   Psychiatric: She has a normal mood and affect. Her behavior is normal.  Nursing note and vitals reviewed.    UC Treatments / Results  Labs (all labs ordered are listed, but only abnormal results are displayed) Labs Reviewed  ROCKY MTN SPOTTED FVR ABS PNL(IGG+IGM)  B. BURGDORFI  ANTIBODIES  POCT CBC W AUTO DIFF ([place])    EKG None  Radiology No results found.  Procedures Procedures (including critical care time)  Medications Ordered in UC Medications  ondansetron (ZOFRAN-ODT) disintegrating tablet 4 mg (4 mg Oral Given 12/11/17 1145)    Initial Impression / Assessment and Plan / UC Course  I  have reviewed the triage vital signs and the nursing notes.  Pertinent labs & imaging results that were available during my care of the patient were reviewed by me and considered in my medical decision making (see chart for details).     Lesion on neck possible secondary skin infection. Given reported dizziness and time of year, concern for possible tick illness, however, dizziness is most likely due to antihistamine taken as dizziness started after medication taken last night and today. Will start pt on doxycycline while labs pending.   Final Clinical Impressions(s) / UC Diagnoses   Final diagnoses:  Rash and nonspecific skin eruption  Dizziness  Nausea     Discharge Instructions      Your rash may be due to a tick given the time of year.  Those labs may take up to a week to come back but you will be notified once the results are in. Please take the antibiotic as prescribed and take the nausea medication as needed.  Be sure to stay well hydrated with plenty of water and get at least 8 hours of sleep at night. Please take your blood pressure medication as prescribed and STOP taking the antihistamine as that could be contributing to your dizziness.   Please follow up with primary care late this week if not improving as you may need a referral to dermatology.     ED Prescriptions    Medication Sig Dispense Auth. Provider   doxycycline (VIBRAMYCIN) 100 MG capsule Take 1 capsule (100 mg total) by mouth 2 (two) times daily. One po bid x 7 days 14 capsule Dacey Milberger O, PA-C   ondansetron (ZOFRAN ODT) 4 MG disintegrating tablet 4mg  ODT q4  hours prn nausea/vomit 8 tablet Noe Gens, PA-C     Controlled Substance Prescriptions [place] Controlled Substance Registry consulted? Not Applicable   Tyrell Antonio 12/11/17 1523

## 2017-12-11 NOTE — ED Triage Notes (Signed)
Patient c/o red area on her clavicle area, also red spots under her arms, seen her PCP last week, unsure of what it is, seems to be worse, feeling dizzy this am.

## 2017-12-11 NOTE — Discharge Instructions (Signed)
°  Your rash may be due to a tick given the time of year.  Those labs may take up to a week to come back but you will be notified once the results are in. Please take the antibiotic as prescribed and take the nausea medication as needed.  Be sure to stay well hydrated with plenty of water and get at least 8 hours of sleep at night. Please take your blood pressure medication as prescribed and STOP taking the antihistamine as that could be contributing to your dizziness.   Please follow up with primary care late this week if not improving as you may need a referral to dermatology.

## 2017-12-12 DIAGNOSIS — Z8349 Family history of other endocrine, nutritional and metabolic diseases: Secondary | ICD-10-CM | POA: Insufficient documentation

## 2017-12-12 DIAGNOSIS — Z85820 Personal history of malignant melanoma of skin: Secondary | ICD-10-CM | POA: Insufficient documentation

## 2017-12-12 LAB — ROCKY MTN SPOTTED FVR ABS PNL(IGG+IGM)
RMSF IgG: NOT DETECTED
RMSF IgM: NOT DETECTED

## 2017-12-12 LAB — B. BURGDORFI ANTIBODIES: B burgdorferi Ab IgG+IgM: <0.9 index

## 2017-12-12 LAB — EXTRA LAV TOP TUBE

## 2017-12-16 ENCOUNTER — Encounter: Payer: Self-pay | Admitting: Physician Assistant

## 2017-12-20 DIAGNOSIS — S80862A Insect bite (nonvenomous), left lower leg, initial encounter: Secondary | ICD-10-CM | POA: Diagnosis not present

## 2017-12-20 DIAGNOSIS — Z711 Person with feared health complaint in whom no diagnosis is made: Secondary | ICD-10-CM | POA: Diagnosis not present

## 2017-12-26 DIAGNOSIS — R748 Abnormal levels of other serum enzymes: Secondary | ICD-10-CM | POA: Diagnosis not present

## 2017-12-26 DIAGNOSIS — Z8349 Family history of other endocrine, nutritional and metabolic diseases: Secondary | ICD-10-CM | POA: Diagnosis not present

## 2017-12-28 NOTE — Progress Notes (Signed)
Call pt: b12 looks better. Decreased some.  CBC looks much better.

## 2017-12-29 LAB — CBC WITH DIFFERENTIAL/PLATELET
BASOS ABS: 31 {cells}/uL (ref 0–200)
Basophils Relative: 0.6 %
EOS ABS: 112 {cells}/uL (ref 15–500)
Eosinophils Relative: 2.2 %
HCT: 42.2 % (ref 35.0–45.0)
Hemoglobin: 14.6 g/dL (ref 11.7–15.5)
Lymphs Abs: 1617 cells/uL (ref 850–3900)
MCH: 30.7 pg (ref 27.0–33.0)
MCHC: 34.6 g/dL (ref 32.0–36.0)
MCV: 88.8 fL (ref 80.0–100.0)
MONOS PCT: 10.7 %
MPV: 12.3 fL (ref 7.5–12.5)
Neutro Abs: 2795 cells/uL (ref 1500–7800)
Neutrophils Relative %: 54.8 %
PLATELETS: 209 10*3/uL (ref 140–400)
RBC: 4.75 10*6/uL (ref 3.80–5.10)
RDW: 12.6 % (ref 11.0–15.0)
TOTAL LYMPHOCYTE: 31.7 %
WBC mixed population: 546 cells/uL (ref 200–950)
WBC: 5.1 10*3/uL (ref 3.8–10.8)

## 2017-12-29 LAB — B12 AND FOLATE PANEL
Folate: 14.2 ng/mL
Vitamin B-12: 1319 pg/mL — ABNORMAL HIGH (ref 200–1100)

## 2017-12-29 LAB — MTHFR DNA ANALYSIS

## 2017-12-30 ENCOUNTER — Encounter: Payer: Self-pay | Admitting: Physician Assistant

## 2017-12-30 DIAGNOSIS — Z1589 Genetic susceptibility to other disease: Secondary | ICD-10-CM | POA: Insufficient documentation

## 2017-12-30 DIAGNOSIS — E7212 Methylenetetrahydrofolate reductase deficiency: Secondary | ICD-10-CM | POA: Insufficient documentation

## 2017-12-30 NOTE — Progress Notes (Signed)
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 I think it would be a good idea to see a genetic counselor and go over risk and any intervention needed. Are you ok with referral?

## 2018-01-01 ENCOUNTER — Emergency Department (INDEPENDENT_AMBULATORY_CARE_PROVIDER_SITE_OTHER)
Admission: EM | Admit: 2018-01-01 | Discharge: 2018-01-01 | Disposition: A | Discharge status: Home / Self Care | Payer: Medicare HMO | Source: Home / Self Care | Attending: Family Medicine | Admitting: Family Medicine

## 2018-01-01 ENCOUNTER — Other Ambulatory Visit: Payer: Self-pay

## 2018-01-01 ENCOUNTER — Encounter: Payer: Self-pay | Admitting: Emergency Medicine

## 2018-01-01 DIAGNOSIS — L304 Erythema intertrigo: Secondary | ICD-10-CM | POA: Diagnosis not present

## 2018-01-01 MED ORDER — KETOCONAZOLE 2 % EX CREA: 1.0000 "application " | TOPICAL_CREAM | Freq: Two times a day (BID) | CUTANEOUS | 1 refills | Status: DC

## 2018-01-01 NOTE — ED Provider Notes (Signed)
[facility]    CSN: [id number] Arrival date & time: 01/01/18  1058     History   Chief Complaint Chief Complaint  Patient presents with  . Rash    HPI Theresa Herrera is a 67 y.o. female.   Patient complains of two month history of an expanding rash on her left trapezius area and a smaller similar rash on her right trapezius area.  The lesions have not improved with a topical antibiotic, and later triamcinolone 0.1% cream.  Later, it appeared superficially infected and she was prescribed doxycycline, which she had to discontinue because of swelling in her tongue. During the past two weeks she has developed several small erythematous macules in her axillary areas, and yesterday developed bilateral erythematous burning rash under both breasts.  The history is provided by the patient.  Rash  Location: beneath both breasts. Quality: burning, itchiness and redness   Quality: not blistering, not bruising, not draining, not dry, not painful, not peeling, not scaling, not swelling and not weeping   Severity:  Moderate Onset quality:  Sudden Duration:  1 day Timing:  Constant Progression:  Worsening Chronicity:  New Context: not animal contact, not chemical exposure, not food, not medications, not new detergent/soap and not plant contact   Relieved by:  Nothing Worsened by:  Nothing Ineffective treatments:  None tried Associated symptoms: no abdominal pain, no diarrhea, no fatigue, no fever, no induration, no joint pain, no myalgias, no nausea and no sore throat     Past Medical History:  Diagnosis Date  . Hyperlipidemia   . Hypertension     Patient Active Problem List   Diagnosis Date Noted  . MTHFR mutation ([place]) 12/30/2017  . Family history of MTHFR deficiency 12/12/2017  . History of melanoma 12/12/2017  . Melanoma of skin ([place]) 10/04/2017  . Elevated vitamin B12 level 09/26/2015  . Hyperlipidemia 09/26/2015  . 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 Essential  hypertension, benign 09/24/2015  . Palpitations 09/24/2015  . 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 TONSILLECTOMY  1958  . TUBAL LIGATION      OB History   None      Home Medications    Prior to Admission medications   Medication Sig Start Date End Date Taking? Authorizing Provider  AMBULATORY NON FORMULARY MEDICATION Vitamin D green drink    [provider]  aspirin 81 MG tablet Take 81 mg by mouth daily.    [provider]  calcium carbonate (OSCAL) 1500 (600 Ca) MG TABS tablet Take 600 mg by mouth 2 (two) times daily with a meal.    [provider]  ketoconazole (NIZORAL) 2 % cream Apply 1 application topically 2 (two) times daily. 01/01/18   Kandra Nicolas, MD  metoprolol succinate (TOPROL-XL) 50 MG 24 hr tablet Take 1 tablet (50 mg total) by mouth daily. Take with or immediately following a meal. 10/04/17   Breeback, Jade L, PA-C  ondansetron (ZOFRAN ODT) 4 MG disintegrating tablet 4mg  ODT q4 hours prn nausea/vomit 12/11/17   Noe Gens, PA-C  triamcinolone cream (KENALOG) 0.1 % Apply 1 application topically 2 (two) times daily. 12/07/17   Donella Stade, PA-C    Family History Family History  Problem Relation Age of Onset  . Hypertension Father   . Hyperlipidemia Father   . Heart attack Father   . Cancer Mother   . Hyperlipidemia Mother   . Hypertension Mother  Social History Social History   Tobacco Use  . Smoking status: Never Smoker  . Smokeless tobacco: Never Used  Substance Use Topics  . Alcohol use: No  . Drug use: Not Currently     Allergies   Doxycycline   Review of Systems Review of Systems  Constitutional: Negative for fatigue and fever.  HENT: Negative for sore throat.   Gastrointestinal: Negative for abdominal pain, diarrhea and nausea.  Musculoskeletal: Negative for arthralgias and myalgias.  Skin: Positive for rash.  All other systems reviewed and are  negative.    Physical Exam Triage Vital Signs ED Triage Vitals [01/01/18 1116]  Enc Vitals Group     BP (!) 164/83     Pulse Rate (!) 111     Resp      Temp 98.3 F (36.8 C)     Temp Source Oral     SpO2 99 %     Weight 141 lb (64 kg)     Height 5\' 1"  (1.549 m)     Head Circumference      Peak Flow      Pain Score 7     Pain Loc      Pain Edu?      Excl. in [place]?    No data found.  Updated Vital Signs BP (!) 164/83 (BP Location: Right Arm)   Pulse (!) 111   Temp 98.3 F (36.8 C) (Oral)   Ht 5\' 1"  (1.549 m)   Wt 141 lb (64 kg)   SpO2 99%   BMI 26.64 kg/m   Visual Acuity Right Eye Distance:   Left Eye Distance:   Bilateral Distance:    Right Eye Near:   Left Eye Near:    Bilateral Near:     Physical Exam  Constitutional: She appears well-developed and well-nourished. No distress.  HENT:  Head: Atraumatic.  Right Ear: External ear normal.  Left Ear: External ear normal.  Nose: Nose normal.  Mouth/Throat: Oropharynx is clear and moist.  Eyes: Pupils are equal, round, and reactive to light. Conjunctivae are normal.  Neck: Neck supple.  Cardiovascular: Normal heart sounds.  Rate 111  Pulmonary/Chest: Breath sounds normal.      Maculopapular eruption on bilateral trapezius areas is pink in color with numerous raised soft nodules 1 to 67mm diameter.  Beneath each breast is confluent erythema with well defined margins.  No induration, fluctuance or tenderness.  Several small macular similar lesions in each axilla.    Musculoskeletal: She exhibits no edema.  Lymphadenopathy:    She has no cervical adenopathy.  Neurological: She is alert.  Skin: Skin is warm and dry.  Nursing note and vitals reviewed.    UC Treatments / Results  Labs (all labs ordered are listed, but only abnormal results are displayed) Labs Reviewed - No data to display  EKG None  Radiology No results found.  Procedures Procedures (including critical care  time)  Medications Ordered in UC Medications - No data to display  Initial Impression / Assessment and Plan / UC Course  I have reviewed the triage vital signs and the nursing notes.  Pertinent labs & imaging results that were available during my care of the patient were reviewed by me and considered in my medical decision making (see chart for details).    Begin Nizoral cream to rash beneath breasts and axillae. Patient has history of melanoma.  Rash on trapezius areas is of concern for possible melanoma variant such as [place] Maligna Melanoma.  Highly recommend she follow-up with her dermatologist scheduled 01/05/18 and consider biopsy.   Final Clinical Impressions(s) / UC Diagnoses   Final diagnoses:  Intertrigo     Discharge Instructions     May apply Calamine lotion or Benadryl cream to rash on upper back for itching. Use new prescription on rash beneath breasts for about two weeks until rash resolved.    ED Prescriptions    Medication Sig Dispense Auth. Provider   ketoconazole (NIZORAL) 2 % cream Apply 1 application topically 2 (two) times daily. 30 g Kandra Nicolas, MD        Kandra Nicolas, MD 01/02/18 Kathyrn Drown

## 2018-01-01 NOTE — Discharge Instructions (Addendum)
May apply Calamine lotion or Benadryl cream to rash on upper back for itching. Use new prescription on rash beneath breasts for about two weeks until rash resolved.

## 2018-01-01 NOTE — ED Triage Notes (Signed)
67 y.o female presents c/o red spots on clavicle, under breasts and on bilateral arms. States she has been using tea tree oil and calomine cream with no relief. States she was seen on 12/11/17 and given Doxycyline but on day 6 of taking those medications she developed tongue swelling and stopped taking it. She denies any other treatment.

## 2018-01-03 NOTE — Addendum Note (Signed)
Addended by: Donella Stade on: 01/03/2018 09:12 AM   Modules accepted: Orders

## 2018-01-03 NOTE — Progress Notes (Signed)
Done

## 2018-01-05 DIAGNOSIS — L249 Irritant contact dermatitis, unspecified cause: Secondary | ICD-10-CM | POA: Diagnosis not present

## 2018-01-09 ENCOUNTER — Encounter: Payer: Self-pay | Admitting: Genetics

## 2018-01-09 ENCOUNTER — Telehealth: Payer: Self-pay | Admitting: Genetics

## 2018-01-09 NOTE — Telephone Encounter (Signed)
Pt called wanting to schedule a genetic counseling appt. Appt has been scheduled for the pt to see Ferol Luz on 8/13 at 10am. Pt aware to arrive 30 minutes early. Letter mailed.

## 2018-01-15 ENCOUNTER — Encounter: Payer: Self-pay | Admitting: Physician Assistant

## 2018-01-15 DIAGNOSIS — E7212 Methylenetetrahydrofolate reductase deficiency: Secondary | ICD-10-CM

## 2018-01-15 DIAGNOSIS — Z1589 Genetic susceptibility to other disease: Secondary | ICD-10-CM

## 2018-01-17 ENCOUNTER — Encounter: Payer: Self-pay | Admitting: Physician Assistant

## 2018-01-19 DIAGNOSIS — L249 Irritant contact dermatitis, unspecified cause: Secondary | ICD-10-CM | POA: Diagnosis not present

## 2018-01-20 ENCOUNTER — Other Ambulatory Visit: Payer: Self-pay | Admitting: Physician Assistant

## 2018-01-20 DIAGNOSIS — Z7689 Persons encountering health services in other specified circumstances: Secondary | ICD-10-CM | POA: Diagnosis not present

## 2018-01-24 LAB — HOMOCYSTEINE: Homocysteine: 13.3 umol/L (ref 0.0–15.0)

## 2018-01-24 LAB — HISTAMINE DETERMINATION, BLOOD: HISTAMINE DETERMINATION, BLOOD: 81 ng/mL (ref 12–127)

## 2018-01-25 NOTE — Telephone Encounter (Signed)
Histamine/homocystine normal level. Do you need copy. We are going to scan into chart as well.

## 2018-01-26 ENCOUNTER — Encounter: Payer: Self-pay | Admitting: Physician Assistant

## 2018-01-26 NOTE — Progress Notes (Signed)
In box to call patient with normal levels. She is seeing a Dietitian. She may want to pick up copy.

## 2018-01-27 ENCOUNTER — Encounter: Payer: Self-pay | Admitting: Physician Assistant

## 2018-02-16 ENCOUNTER — Telehealth: Payer: Self-pay | Admitting: Genetics

## 2018-02-16 NOTE — Telephone Encounter (Signed)
Pt cld and cancelled genetic counseling appt with Ferol Luz. Msg received from my voicemail.

## 2018-02-20 ENCOUNTER — Encounter: Payer: Medicare HMO | Admitting: Genetics

## 2018-02-21 ENCOUNTER — Inpatient Hospital Stay: Payer: Medicare HMO | Admitting: Genetics

## 2018-02-21 ENCOUNTER — Inpatient Hospital Stay: Payer: Medicare HMO

## 2018-05-09 DIAGNOSIS — H524 Presbyopia: Secondary | ICD-10-CM | POA: Diagnosis not present

## 2018-05-09 DIAGNOSIS — D3131 Benign neoplasm of right choroid: Secondary | ICD-10-CM | POA: Diagnosis not present

## 2018-05-09 DIAGNOSIS — H2513 Age-related nuclear cataract, bilateral: Secondary | ICD-10-CM | POA: Diagnosis not present

## 2018-09-14 DIAGNOSIS — D3131 Benign neoplasm of right choroid: Secondary | ICD-10-CM | POA: Diagnosis not present

## 2019-06-04 DIAGNOSIS — Z0100 Encounter for examination of eyes and vision without abnormal findings: Secondary | ICD-10-CM | POA: Diagnosis not present

## 2019-06-04 DIAGNOSIS — H524 Presbyopia: Secondary | ICD-10-CM | POA: Diagnosis not present

## 2019-07-20 DIAGNOSIS — H00025 Hordeolum internum left lower eyelid: Secondary | ICD-10-CM | POA: Diagnosis not present

## 2019-10-04 DIAGNOSIS — Z8249 Family history of ischemic heart disease and other diseases of the circulatory system: Secondary | ICD-10-CM | POA: Diagnosis not present

## 2019-10-04 DIAGNOSIS — Z881 Allergy status to other antibiotic agents status: Secondary | ICD-10-CM | POA: Diagnosis not present

## 2019-10-04 DIAGNOSIS — L309 Dermatitis, unspecified: Secondary | ICD-10-CM | POA: Diagnosis not present

## 2019-10-04 DIAGNOSIS — Z87891 Personal history of nicotine dependence: Secondary | ICD-10-CM | POA: Diagnosis not present

## 2019-10-04 DIAGNOSIS — Z85828 Personal history of other malignant neoplasm of skin: Secondary | ICD-10-CM | POA: Diagnosis not present

## 2019-10-04 DIAGNOSIS — Z7982 Long term (current) use of aspirin: Secondary | ICD-10-CM | POA: Diagnosis not present

## 2019-10-04 DIAGNOSIS — Z809 Family history of malignant neoplasm, unspecified: Secondary | ICD-10-CM | POA: Diagnosis not present

## 2019-12-25 ENCOUNTER — Encounter: Payer: Self-pay | Admitting: Physician Assistant

## 2019-12-25 DIAGNOSIS — Z8616 Personal history of COVID-19: Secondary | ICD-10-CM

## 2019-12-26 DIAGNOSIS — Z8616 Personal history of COVID-19: Secondary | ICD-10-CM | POA: Diagnosis not present

## 2019-12-27 LAB — SARS-COV-2 ANTIBODY(IGG)SPIKE,SEMI-QUANTITATIVE: SARS COV1 AB(IGG)SPIKE,SEMI QN: <1 index (ref <1.00)

## 2019-12-28 NOTE — Telephone Encounter (Signed)
Theresa Herrera,   No antibodies detected for Covid.   Luvenia Starch

## 2020-06-16 DIAGNOSIS — R69 Illness, unspecified: Secondary | ICD-10-CM | POA: Diagnosis not present

## 2020-08-08 ENCOUNTER — Telehealth: Payer: Self-pay | Admitting: General Practice

## 2020-08-14 ENCOUNTER — Encounter: Payer: Self-pay | Admitting: Physician Assistant

## 2020-08-15 MED ORDER — BETAMETHASONE DIPROPIONATE 0.05 % EX OINT: TOPICAL_OINTMENT | Freq: Two times a day (BID) | CUTANEOUS | 0 refills | Status: DC

## 2020-08-20 DIAGNOSIS — Z85820 Personal history of malignant melanoma of skin: Secondary | ICD-10-CM | POA: Diagnosis not present

## 2020-08-20 DIAGNOSIS — L905 Scar conditions and fibrosis of skin: Secondary | ICD-10-CM | POA: Diagnosis not present

## 2020-08-20 DIAGNOSIS — L814 Other melanin hyperpigmentation: Secondary | ICD-10-CM | POA: Diagnosis not present

## 2020-08-20 DIAGNOSIS — D225 Melanocytic nevi of trunk: Secondary | ICD-10-CM | POA: Diagnosis not present

## 2020-08-20 DIAGNOSIS — Z85828 Personal history of other malignant neoplasm of skin: Secondary | ICD-10-CM | POA: Diagnosis not present

## 2020-08-20 DIAGNOSIS — L821 Other seborrheic keratosis: Secondary | ICD-10-CM | POA: Diagnosis not present

## 2020-08-29 ENCOUNTER — Telehealth: Payer: Self-pay | Admitting: Neurology

## 2020-08-29 ENCOUNTER — Ambulatory Visit (INDEPENDENT_AMBULATORY_CARE_PROVIDER_SITE_OTHER): Payer: Medicare HMO | Admitting: Physician Assistant

## 2020-08-29 ENCOUNTER — Encounter: Payer: Self-pay | Admitting: Physician Assistant

## 2020-08-29 ENCOUNTER — Other Ambulatory Visit: Payer: Self-pay

## 2020-08-29 VITALS — BP 170/72 | HR 93 | Ht 61.0 in | Wt 147.0 lb

## 2020-08-29 DIAGNOSIS — Z1329 Encounter for screening for other suspected endocrine disorder: Secondary | ICD-10-CM | POA: Diagnosis not present

## 2020-08-29 DIAGNOSIS — Z1231 Encounter for screening mammogram for malignant neoplasm of breast: Secondary | ICD-10-CM | POA: Diagnosis not present

## 2020-08-29 DIAGNOSIS — E782 Mixed hyperlipidemia: Secondary | ICD-10-CM

## 2020-08-29 DIAGNOSIS — Z13820 Encounter for screening for osteoporosis: Secondary | ICD-10-CM

## 2020-08-29 DIAGNOSIS — R002 Palpitations: Secondary | ICD-10-CM | POA: Diagnosis not present

## 2020-08-29 DIAGNOSIS — I10 Essential (primary) hypertension: Secondary | ICD-10-CM

## 2020-08-29 DIAGNOSIS — Z1211 Encounter for screening for malignant neoplasm of colon: Secondary | ICD-10-CM | POA: Diagnosis not present

## 2020-08-29 DIAGNOSIS — Z0000 Encounter for general adult medical examination without abnormal findings: Secondary | ICD-10-CM

## 2020-08-29 MED ORDER — METOPROLOL SUCCINATE ER 100 MG PO TB24: 100.0000 mg | ORAL_TABLET | Freq: Every day | ORAL | 3 refills | Status: DC

## 2020-08-29 NOTE — Patient Instructions (Signed)
Health Maintenance After Age 69 After age 69, you are at a higher risk for certain long-term diseases and infections as well as injuries from falls. Falls are a major cause of broken bones and head injuries in people who are older than age 69. Getting regular preventive care can help to keep you healthy and well. Preventive care includes getting regular testing and making lifestyle changes as recommended by your health care provider. Talk with your health care provider about:  Which screenings and tests you should have. A screening is a test that checks for a disease when you have no symptoms.  A diet and exercise plan that is right for you. What should I know about screenings and tests to prevent falls? Screening and testing are the best ways to find a health problem early. Early diagnosis and treatment give you the best chance of managing medical conditions that are common after age 69. Certain conditions and lifestyle choices may make you more likely to have a fall. Your health care provider may recommend:  Regular vision checks. Poor vision and conditions such as cataracts can make you more likely to have a fall. If you wear glasses, make sure to get your prescription updated if your vision changes.  Medicine review. Work with your health care provider to regularly review all of the medicines you are taking, including over-the-counter medicines. Ask your health care provider about any side effects that may make you more likely to have a fall. Tell your health care provider if any medicines that you take make you feel dizzy or sleepy.  Osteoporosis screening. Osteoporosis is a condition that causes the bones to get weaker. This can make the bones weak and cause them to break more easily.  Blood pressure screening. Blood pressure changes and medicines to control blood pressure can make you feel dizzy.  Strength and balance checks. Your health care provider may recommend certain tests to check your  strength and balance while standing, walking, or changing positions.  Foot health exam. Foot pain and numbness, as well as not wearing proper footwear, can make you more likely to have a fall.  Depression screening. You may be more likely to have a fall if you have a fear of falling, feel emotionally low, or feel unable to do activities that you used to do.  Alcohol use screening. Using too much alcohol can affect your balance and may make you more likely to have a fall. What actions can I take to lower my risk of falls? General instructions  Talk with your health care provider about your risks for falling. Tell your health care provider if: ? You fall. Be sure to tell your health care provider about all falls, even ones that seem minor. ? You feel dizzy, sleepy, or off-balance.  Take over-the-counter and prescription medicines only as told by your health care provider. These include any supplements.  Eat a healthy diet and maintain a healthy weight. A healthy diet includes low-fat dairy products, low-fat (lean) meats, and fiber from whole grains, beans, and lots of fruits and vegetables. Home safety  Remove any tripping hazards, such as rugs, cords, and clutter.  Install safety equipment such as grab bars in bathrooms and safety rails on stairs.  Keep rooms and walkways well-lit. Activity  Follow a regular exercise program to stay fit. This will help you maintain your balance. Ask your health care provider what types of exercise are appropriate for you.  If you need a cane or walker,   use it as recommended by your health care provider.  Wear supportive shoes that have nonskid soles.   Lifestyle  Do not drink alcohol if your health care provider tells you not to drink.  If you drink alcohol, limit how much you have: ? 0-1 drink a day for women. ? 0-2 drinks a day for men.  Be aware of how much alcohol is in your drink. In the [place] one drink equals one typical bottle of beer (12  oz), one-half glass of wine (5 oz), or one shot of hard liquor (1 oz).  Do not use any products that contain nicotine or tobacco, such as cigarettes and e-cigarettes. If you need help quitting, ask your health care provider. Summary  Having a healthy lifestyle and getting preventive care can help to protect your health and wellness after age 69.  Screening and testing are the best way to find a health problem early and help you avoid having a fall. Early diagnosis and treatment give you the best chance for managing medical conditions that are more common for people who are older than age 69.  Falls are a major cause of broken bones and head injuries in people who are older than age 69. Take precautions to prevent a fall at home.  Work with your health care provider to learn what changes you can make to improve your health and wellness and to prevent falls. This information is not intended to replace advice given to you by your health care provider. Make sure you discuss any questions you have with your health care provider. Document Revised: 10/19/2018 Document Reviewed: 05/11/2017 Elsevier Patient Education  2021 Elsevier Inc.  

## 2020-08-29 NOTE — Telephone Encounter (Signed)
Documentation only.

## 2020-08-29 NOTE — Telephone Encounter (Signed)
Cologuard order faxed to [phone] with confirmation received. They will contact the patient directly.   

## 2020-09-01 DIAGNOSIS — Z1329 Encounter for screening for other suspected endocrine disorder: Secondary | ICD-10-CM | POA: Diagnosis not present

## 2020-09-01 DIAGNOSIS — E782 Mixed hyperlipidemia: Secondary | ICD-10-CM | POA: Diagnosis not present

## 2020-09-01 DIAGNOSIS — R790 Abnormal level of blood mineral: Secondary | ICD-10-CM | POA: Diagnosis not present

## 2020-09-01 DIAGNOSIS — I10 Essential (primary) hypertension: Secondary | ICD-10-CM | POA: Diagnosis not present

## 2020-09-01 DIAGNOSIS — Z0000 Encounter for general adult medical examination without abnormal findings: Secondary | ICD-10-CM | POA: Diagnosis not present

## 2020-09-01 LAB — CBC WITH DIFFERENTIAL/PLATELET
Eosinophils Relative: 0.7 %
Lymphs Abs: 2016 cells/uL (ref 850–3900)
MCH: 31.2 pg (ref 27.0–33.0)
MPV: 12.8 fL — ABNORMAL HIGH (ref 7.5–12.5)

## 2020-09-02 ENCOUNTER — Ambulatory Visit (INDEPENDENT_AMBULATORY_CARE_PROVIDER_SITE_OTHER): Payer: Medicare HMO | Admitting: Physician Assistant

## 2020-09-02 ENCOUNTER — Encounter: Payer: Self-pay | Admitting: Physician Assistant

## 2020-09-02 ENCOUNTER — Other Ambulatory Visit: Payer: Self-pay | Admitting: Neurology

## 2020-09-02 VITALS — BP 176/78 | HR 72 | Ht 61.0 in | Wt 148.0 lb

## 2020-09-02 DIAGNOSIS — R0602 Shortness of breath: Secondary | ICD-10-CM

## 2020-09-02 DIAGNOSIS — E871 Hypo-osmolality and hyponatremia: Secondary | ICD-10-CM

## 2020-09-02 DIAGNOSIS — E782 Mixed hyperlipidemia: Secondary | ICD-10-CM

## 2020-09-02 DIAGNOSIS — R002 Palpitations: Secondary | ICD-10-CM

## 2020-09-02 DIAGNOSIS — I10 Essential (primary) hypertension: Secondary | ICD-10-CM | POA: Diagnosis not present

## 2020-09-02 DIAGNOSIS — R0989 Other specified symptoms and signs involving the circulatory and respiratory systems: Secondary | ICD-10-CM

## 2020-09-02 DIAGNOSIS — R899 Unspecified abnormal finding in specimens from other organs, systems and tissues: Secondary | ICD-10-CM

## 2020-09-02 MED ORDER — ATORVASTATIN CALCIUM 40 MG PO TABS: 40.0000 mg | ORAL_TABLET | Freq: Every day | ORAL | 3 refills | Status: DC

## 2020-09-02 MED ORDER — LISINOPRIL 10 MG PO TABS: 10.0000 mg | ORAL_TABLET | Freq: Every day | ORAL | 0 refills | Status: DC

## 2020-09-02 NOTE — Patient Instructions (Signed)
I will order echocardiogram and carotid ultrasound.

## 2020-09-02 NOTE — Progress Notes (Signed)
Kamry,   You cholesterol remains elevated and with your age your CV risk is really high. Please consider starting a statin for your cholesterol. Thoughts.   Your hemoglobin is a little elevated but no other concerns. Please add iron panel.   Your sodium is also low. Could be some dehydration but this needs to be followed up on. Drink plenty of fluids and follow up in 2 weeks with labs.

## 2020-09-02 NOTE — Progress Notes (Signed)
Subjective:     Theresa Herrera is a 70 y.o. female and is here for a comprehensive physical exam. The patient reports problems - she reported severe palpitations after first covid vaccine. she is better but continues to have palpitations weekly. no CP, headache or vision changes. not checking BP regularly. .  Social History   Socioeconomic History  . Marital status: Married    Spouse name: Not on file  . Number of children: Not on file  . Years of education: Not on file  . Highest education level: Not on file  Occupational History  . Not on file  Tobacco Use  . Smoking status: Never Smoker  . Smokeless tobacco: Never Used  Vaping Use  . Vaping Use: Never used  Substance and Sexual Activity  . Alcohol use: No  . Drug use: Not Currently  . Sexual activity: Never  Other Topics Concern  . Not on file  Social History Narrative  . Not on file   Social Determinants of Health   Financial Resource Strain: Not on file  Food Insecurity: Not on file  Transportation Needs: Not on file  Physical Activity: Not on file  Stress: Not on file  Social Connections: Not on file  Intimate Partner Violence: Not on file   Health Maintenance  Topic Date Due  . Fecal DNA (Cologuard)  Never done  . DEXA SCAN  Never done  . MAMMOGRAM  10/13/2019  . INFLUENZA VACCINE  10/09/2020 (Originally 02/10/2020)  . PNA vac Low Risk Adult (1 of 2 - PCV13) 08/29/2021 (Originally 10/21/2016)  . TETANUS/TDAP  10/05/2027  . Hepatitis C Screening  Completed  . COVID-19 Vaccine  Discontinued    The following portions of the patient's history were reviewed and updated as appropriate: allergies, current medications, past family history, past medical history, past social history, past surgical history and problem list.  Review of Systems Pertinent items noted in HPI and remainder of comprehensive ROS otherwise negative.   Objective:    BP (!) 170/72   Pulse 93   Ht 5\' 1"  (1.549 m)   Wt 147 lb (66.7 kg)   SpO2  100%   BMI 27.78 kg/m  General appearance: alert, cooperative and appears stated age Head: Normocephalic, without obvious abnormality, atraumatic Eyes: conjunctivae/corneas clear. PERRL, EOM's intact. Fundi benign. Ears: normal TM's and external ear canals both ears Nose: Nares normal. Septum midline. Mucosa normal. No drainage or sinus tenderness. Throat: lips, mucosa, and tongue normal; teeth and gums normal Neck: no adenopathy, no carotid bruit, no JVD, supple, symmetrical, trachea midline and thyroid not enlarged, symmetric, no tenderness/mass/nodules Back: symmetric, no curvature. ROM normal. No CVA tenderness. Lungs: clear to auscultation bilaterally Heart: regular rate and rhythm, S1, S2 normal, no murmur, click, rub or gallop Abdomen: soft, non-tender; bowel sounds normal; no masses,  no organomegaly Extremities: extremities normal, atraumatic, no cyanosis or edema Pulses: 2+ and symmetric Skin: Skin color, texture, turgor normal. No rashes or lesions Lymph nodes: Cervical, supraclavicular, and axillary nodes normal. Neurologic: Alert and oriented X 3, normal strength and tone. Normal symmetric reflexes. Normal coordination and gait   .Marland Kitchen Depression screen [facility] 2/9 08/29/2020 10/04/2017  Decreased Interest 1 0  Down, Depressed, Hopeless 1 1  PHQ - 2 Score 2 1  Altered sleeping 0 0  Tired, decreased energy 1 1  Change in appetite 0 1  Feeling bad or failure about yourself  1 1  Trouble concentrating 0 1  Moving slowly or fidgety/restless 0 1  Suicidal thoughts 0 0  PHQ-9 Score 4 6  Difficult doing work/chores Not difficult at all Not difficult at all   .Marland Kitchen GAD 7 : Generalized Anxiety Score 08/29/2020 10/04/2017  Nervous, Anxious, on Edge 1 1  Control/stop worrying 0 1  Worry too much - different things 0 1  Trouble relaxing 1 0  Restless 0 0  Easily annoyed or irritable 0 0  Afraid - awful might happen 0 1  Total GAD 7 Score 2 4  Anxiety Difficulty Not difficult at all Not  difficult at all     Assessment:    Healthy female exam.      Plan:    Marland KitchenMarland KitchenHitomi was seen today for annual exam.  Diagnoses and all orders for this visit:  Routine adult health maintenance -     CBC with Differential/Platelet -     COMPLETE METABOLIC PANEL WITH GFR -     Lipid Panel w/reflex Direct LDL -     TSH  Essential hypertension, benign -     COMPLETE METABOLIC PANEL WITH GFR -     metoprolol succinate (TOPROL-XL) 100 MG 24 hr tablet; Take 1 tablet (100 mg total) by mouth daily. Take with or immediately following a meal.  Mixed hyperlipidemia -     Lipid Panel w/reflex Direct LDL  Thyroid disorder screen -     TSH  Encounter for screening mammogram for malignant neoplasm of breast -     MM 3D SCREEN BREAST BILATERAL  Osteoporosis screening -     DG Bone Density  Colon cancer screening -     Cologuard  Palpitations -     metoprolol succinate (TOPROL-XL) 100 MG 24 hr tablet; Take 1 tablet (100 mg total) by mouth daily. Take with or immediately following a meal.   .. Discussed 150 minutes of exercise a week.  Encouraged vitamin D 1000 units and Calcium 1300mg  or 4 servings of dairy a day.  On ASA daily. PHQ/GAD numbers stable.  Fasting labs ordered.  Mammogram ordered.  Bone density ordered.  cologuard ordered. Colonoscopy declined.  Pap not indicated.  Declined flu and pneumonia. Took first covid vaccine and had weeks of increased palpitations. She will not take anymore of the covid vaccine.   Elevated BP. She does not want to start a more targeted BP medication. She did report increase in palpitations after covid vaccine. Increased metoprolol to 100mg  daily.  It may have a modest effect of BP numbers. Start checking at home and keep readings. Follow up in 4 weeks. Discussed BP needs to be under 150/90.    See After Visit Summary for Counseling Recommendations

## 2020-09-02 NOTE — Progress Notes (Signed)
Subjective:    Patient ID: Theresa Herrera, female    DOB: [birth year], 70 y.o.   MRN: [id number]  HPI  Pt is a 70 yo female with HLD and palpitations who walks into the clinic stating her "heart is beating hard" and " she does not feel good". Pt has a hx of palpitations and metoprolol was increased last week. Her BP was elevated last week as well and she was going to follow up in a few weeks to see if it would go down. She did have labs that showed low sodium at 132. No history of this. She had covid vaccine on 07/31/2020 and has felt this way intermittently since. No fever, chills, cough, swelling. She is short of breath and feels weak. She is drinking pedialyte. No diarrhea, constipation, dysuria, flank pain.   .. Active Ambulatory Problems    Diagnosis Date Noted  . 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 Essential hypertension, benign 09/24/2015  . Palpitations 09/24/2015  . 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 Elevated vitamin B12 level 09/26/2015  . Hyperlipidemia 09/26/2015  . Melanoma of skin ([place]) 10/04/2017  . Family history of MTHFR deficiency 12/12/2017  . History of melanoma 12/12/2017  . MTHFR mutation 12/30/2017  . Uncontrolled hypertension 09/02/2020  . SOB (shortness of breath) on exertion 09/03/2020  . Hyponatremia 09/03/2020  . Left carotid bruit 09/03/2020   Resolved Ambulatory Problems    Diagnosis Date Noted  . No Resolved Ambulatory Problems   Past Medical History:  Diagnosis Date  . Hypertension      Review of Systems See HPI.     Objective:   Physical Exam Vitals reviewed.  Constitutional:      Comments: Flushed cheeks  HENT:     Head: Normocephalic.  Neck:     Vascular: Carotid bruit present.     Comments: Left carotid bruit Cardiovascular:     Rate and Rhythm: Normal rate and regular rhythm.     Pulses: Normal pulses.     Heart sounds: No murmur heard.   Pulmonary:     Effort: Pulmonary effort is normal.  Musculoskeletal:     Right lower leg: No  edema.     Left lower leg: No edema.  Neurological:     General: No focal deficit present.     Mental Status: She is alert and oriented to person, place, and time.  Psychiatric:     Comments: Anxious about health.           Assessment & Plan:  Marland KitchenMarland KitchenShatonia was seen today for palpitations.  Diagnoses and all orders for this visit:  Palpitations -     EKG 12-Lead -     BASIC METABOLIC PANEL WITH GFR -     CBC with Differential/Platelet -     ECHOCARDIOGRAM COMPLETE -     Cardiac Stress Test: Informed Consent Details: Physician/Practitioner Attestation; Transcribe to consent form and obtain patient signature  Uncontrolled hypertension -     BASIC METABOLIC PANEL WITH GFR -     CBC with Differential/Platelet -     lisinopril (ZESTRIL) 10 MG tablet; Take 1 tablet (10 mg total) by mouth daily. -     US Carotid Duplex Bilateral  Hyponatremia -     BASIC METABOLIC PANEL WITH GFR -     CBC with Differential/Platelet  SOB (shortness of breath) on exertion -     ECHOCARDIOGRAM COMPLETE -     Cardiac Stress Test: Informed Consent Details: Physician/Practitioner Attestation; Transcribe to  consent form and obtain patient signature  Left carotid bruit -     US Carotid Duplex Bilateral  Mixed hyperlipidemia -     US Carotid Duplex Bilateral   Pt seen last week for CPE. BP elevated then and still elevated. Need to get BP down. Started lisinopril.  Continue metoprolol for palpitations.  New left carotid bruit. Will get echo and carotid U/S. EKG- NSR with sinus arrhthymias at 73. No ST elevation or depression. No PVC. Hx of IRBBB. No signs of ischemia. Repeat labs since sodium was low last visit.   Follow up in Friday in office. Discussed if symptoms worsening please go to ED.    Spent 40 minutes with patient in office discussing labs, test, treatment plan, new medications.

## 2020-09-03 ENCOUNTER — Encounter: Payer: Self-pay | Admitting: Physician Assistant

## 2020-09-03 ENCOUNTER — Other Ambulatory Visit: Payer: Self-pay

## 2020-09-03 ENCOUNTER — Other Ambulatory Visit: Payer: Self-pay | Admitting: Neurology

## 2020-09-03 ENCOUNTER — Ambulatory Visit (INDEPENDENT_AMBULATORY_CARE_PROVIDER_SITE_OTHER): Payer: Medicare HMO

## 2020-09-03 DIAGNOSIS — R0989 Other specified symptoms and signs involving the circulatory and respiratory systems: Secondary | ICD-10-CM | POA: Insufficient documentation

## 2020-09-03 DIAGNOSIS — E871 Hypo-osmolality and hyponatremia: Secondary | ICD-10-CM | POA: Insufficient documentation

## 2020-09-03 DIAGNOSIS — R0602 Shortness of breath: Secondary | ICD-10-CM

## 2020-09-03 DIAGNOSIS — R002 Palpitations: Secondary | ICD-10-CM | POA: Diagnosis not present

## 2020-09-03 DIAGNOSIS — I10 Essential (primary) hypertension: Secondary | ICD-10-CM | POA: Diagnosis not present

## 2020-09-03 LAB — CBC WITH DIFFERENTIAL/PLATELET
Absolute Monocytes: 561 cells/uL (ref 200–950)
Basophils Absolute: 28 cells/uL (ref 0–200)
Basophils Relative: 0.4 %
Eosinophils Absolute: 50 cells/uL (ref 15–500)
HCT: 45.2 % — ABNORMAL HIGH (ref 35.0–45.0)
Hemoglobin: 15.6 g/dL — ABNORMAL HIGH (ref 11.7–15.5)
MCHC: 34.5 g/dL (ref 32.0–36.0)
MCV: 90.4 fL (ref 80.0–100.0)
Monocytes Relative: 7.9 %
Neutro Abs: 4445 cells/uL (ref 1500–7800)
Neutrophils Relative %: 62.6 %
Platelets: 212 10*3/uL (ref 140–400)
RBC: 5 10*6/uL (ref 3.80–5.10)
RDW: 12.3 % (ref 11.0–15.0)
Total Lymphocyte: 28.4 %
WBC: 7.1 10*3/uL (ref 3.8–10.8)

## 2020-09-03 LAB — COMPLETE METABOLIC PANEL WITH GFR
AG Ratio: 2 (calc) (ref 1.0–2.5)
ALT: 27 U/L (ref 6–29)
AST: 20 U/L (ref 10–35)
Albumin: 4.9 g/dL (ref 3.6–5.1)
Alkaline phosphatase (APISO): 88 U/L (ref 37–153)
BUN: 10 mg/dL (ref 7–25)
CO2: 26 mmol/L (ref 20–32)
Calcium: 9.9 mg/dL (ref 8.6–10.4)
Chloride: 96 mmol/L — ABNORMAL LOW (ref 98–110)
Creat: 0.75 mg/dL (ref 0.50–0.99)
GFR, Est African American: 95 mL/min/{1.73_m2} (ref >=60)
GFR, Est Non African American: 82 mL/min/{1.73_m2} (ref >=60)
Globulin: 2.4 g/dL (calc) (ref 1.9–3.7)
Glucose, Bld: 94 mg/dL (ref 65–99)
Potassium: 3.9 mmol/L (ref 3.5–5.3)
Sodium: 132 mmol/L — ABNORMAL LOW (ref 135–146)
Total Bilirubin: 0.8 mg/dL (ref 0.2–1.2)
Total Protein: 7.3 g/dL (ref 6.1–8.1)

## 2020-09-03 LAB — LIPID PANEL W/REFLEX DIRECT LDL
Cholesterol: 275 mg/dL — ABNORMAL HIGH (ref <200)
HDL: 59 mg/dL (ref >=50)
LDL Cholesterol (Calc): 192 mg/dL (calc) — ABNORMAL HIGH
Non-HDL Cholesterol (Calc): 216 mg/dL (calc) — ABNORMAL HIGH (ref <130)
Total CHOL/HDL Ratio: 4.7 (calc) (ref <5.0)
Triglycerides: 116 mg/dL (ref <150)

## 2020-09-03 LAB — IRON,TIBC AND FERRITIN PANEL
%SAT: 50 % (calc) — ABNORMAL HIGH (ref 16–45)
Ferritin: 109 ng/mL (ref 16–288)
Iron: 180 ug/dL — ABNORMAL HIGH (ref 45–160)
TIBC: 360 mcg/dL (calc) (ref 250–450)

## 2020-09-03 LAB — TSH: TSH: 2.16 mIU/L (ref 0.40–4.50)

## 2020-09-03 NOTE — Progress Notes (Signed)
Theresa Herrera,   Your sodium has dropped again. I think you might be drinking too much water. Limit to 32oz of sports drink a day. Drink very little just water until we get your sodium back up. Ok to eat salty foods. Your sodium this low can make you feel really bad. Not sure what is causing this. Hold on starting lipitor until we get your sodium levels normalized or moving in the right direction. Start lisinopril for BP. Recheck sodium on Friday.   I am going to get chest xray. Come by today to get this as well as drop off urine sample in the lab.   JJ, can we add plasma osmolality and BNP to labs and please put in urine osmolality for patient to collect today.

## 2020-09-03 NOTE — Progress Notes (Signed)
Serum iron is a little too high. Are you taking any extra iron?

## 2020-09-03 NOTE — Progress Notes (Signed)
Normal chest x-ray.

## 2020-09-05 ENCOUNTER — Encounter: Payer: Self-pay | Admitting: Physician Assistant

## 2020-09-05 ENCOUNTER — Ambulatory Visit (INDEPENDENT_AMBULATORY_CARE_PROVIDER_SITE_OTHER): Payer: Medicare HMO | Admitting: Physician Assistant

## 2020-09-05 ENCOUNTER — Other Ambulatory Visit: Payer: Self-pay

## 2020-09-05 VITALS — BP 158/67 | HR 88 | Ht 61.0 in | Wt 144.0 lb

## 2020-09-05 DIAGNOSIS — R0602 Shortness of breath: Secondary | ICD-10-CM | POA: Diagnosis not present

## 2020-09-05 DIAGNOSIS — R002 Palpitations: Secondary | ICD-10-CM | POA: Diagnosis not present

## 2020-09-05 DIAGNOSIS — I10 Essential (primary) hypertension: Secondary | ICD-10-CM

## 2020-09-05 DIAGNOSIS — Z6741 Type O blood, Rh negative: Secondary | ICD-10-CM | POA: Insufficient documentation

## 2020-09-05 DIAGNOSIS — E871 Hypo-osmolality and hyponatremia: Secondary | ICD-10-CM | POA: Diagnosis not present

## 2020-09-05 LAB — CBC WITH DIFFERENTIAL/PLATELET
Absolute Monocytes: 562 cells/uL (ref 200–950)
Basophils Absolute: 8 cells/uL (ref 0–200)
Basophils Relative: 0.1 %
Eosinophils Absolute: 31 cells/uL (ref 15–500)
Eosinophils Relative: 0.4 %
HCT: 41.6 % (ref 35.0–45.0)
Hemoglobin: 14.5 g/dL (ref 11.7–15.5)
Lymphs Abs: 1709 cells/uL (ref 850–3900)
MCH: 31.3 pg (ref 27.0–33.0)
MCHC: 34.9 g/dL (ref 32.0–36.0)
MCV: 89.8 fL (ref 80.0–100.0)
MPV: 11.9 fL (ref 7.5–12.5)
Monocytes Relative: 7.3 %
Neutro Abs: 5390 cells/uL (ref 1500–7800)
Neutrophils Relative %: 70 %
Platelets: 212 10*3/uL (ref 140–400)
RBC: 4.63 10*6/uL (ref 3.80–5.10)
RDW: 12.4 % (ref 11.0–15.0)
Total Lymphocyte: 22.2 %
WBC: 7.7 10*3/uL (ref 3.8–10.8)

## 2020-09-05 LAB — BASIC METABOLIC PANEL WITH GFR
BUN: 7 mg/dL (ref 7–25)
CO2: 25 mmol/L (ref 20–32)
Calcium: 9.6 mg/dL (ref 8.6–10.4)
Chloride: 91 mmol/L — ABNORMAL LOW (ref 98–110)
Creat: 0.63 mg/dL (ref 0.50–0.99)
GFR, Est African American: 107 mL/min/{1.73_m2} (ref >=60)
GFR, Est Non African American: 92 mL/min/{1.73_m2} (ref >=60)
Glucose, Bld: 111 mg/dL (ref 65–139)
Potassium: 3.8 mmol/L (ref 3.5–5.3)
Sodium: 125 mmol/L — ABNORMAL LOW (ref 135–146)

## 2020-09-05 LAB — OSMOLALITY: Osmolality: 269 mOsm/kg — ABNORMAL LOW (ref 278–305)

## 2020-09-05 NOTE — Progress Notes (Signed)
Can we add urinary sodium to this?

## 2020-09-05 NOTE — Progress Notes (Signed)
Subjective:    Patient ID: Theresa Herrera, female    DOB: 11-22-1950, 70 y.o.   MRN: [id number]  HPI  Pt is a 70 yo female who presents to the clinic for follow up after 2 weeks of increase in palpitations, worsening uncontrolled HTN, hyponatremia.   Last visit I added lisinopril to metoprolol for blood pressure. She was told to limit hydration and increase salt. She does feel some better. She is here for recheck and labs today. No CP. Palpitations have improved. No significant SOB. She has had some loose stools a few times which worry her.   Echo scheduled for march. Carotid u/s scheduled for next week.   .. Active Ambulatory Problems    Diagnosis Date Noted  . 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 Essential hypertension, benign 09/24/2015  . Palpitations 09/24/2015  . 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 Elevated vitamin B12 level 09/26/2015  . Hyperlipidemia 09/26/2015  . Melanoma of skin ([place]) 10/04/2017  . Family history of MTHFR deficiency 12/12/2017  . History of melanoma 12/12/2017  . MTHFR mutation 12/30/2017  . Uncontrolled hypertension 09/02/2020  . SOB (shortness of breath) on exertion 09/03/2020  . Hyponatremia 09/03/2020  . Left carotid bruit 09/03/2020  . Type O blood, Rh negative 09/05/2020   Resolved Ambulatory Problems    Diagnosis Date Noted  . No Resolved Ambulatory Problems   Past Medical History:  Diagnosis Date  . Hypertension        Review of Systems   see HPI.  Objective:   Physical Exam Vitals reviewed.  Constitutional:      Appearance: Normal appearance.  Neck:     Vascular: Carotid bruit present.  Cardiovascular:     Rate and Rhythm: Normal rate and regular rhythm.     Pulses: Normal pulses.     Heart sounds: Normal heart sounds.  Pulmonary:     Effort: Pulmonary effort is normal.     Breath sounds: Normal breath sounds. No wheezing or rhonchi.  Abdominal:     General: Bowel sounds are normal. There is no distension.     Palpations:  Abdomen is soft.     Tenderness: There is no abdominal tenderness. There is no right CVA tenderness, left CVA tenderness or guarding.  Musculoskeletal:     Right lower leg: No edema.     Left lower leg: No edema.  Neurological:     General: No focal deficit present.     Mental Status: She is alert and oriented to person, place, and time.  Psychiatric:        Mood and Affect: Mood normal.           Assessment & Plan:  Marland KitchenMarland KitchenJimmye was seen today for follow-up.  Diagnoses and all orders for this visit:  Uncontrolled hypertension -     Brain natriuretic peptide -     Osmolality -     Basic metabolic panel -     Osmolality, urine  Hyponatremia -     Brain natriuretic peptide -     Osmolality -     Basic metabolic panel -     Osmolality, urine  SOB (shortness of breath) on exertion -     Brain natriuretic peptide -     Osmolality -     Basic metabolic panel -     Osmolality, urine  Palpitations -     Brain natriuretic peptide -     Osmolality -     Basic metabolic panel -  Osmolality, urine   BP has improved but not to goal. Increased lisinopril to 20mg  daily. Recheck on Wednesday.   Labs ordered for osmolality of plasma and urine for further hyponatremia work up.   Hold start of lipitor until this as resolved.

## 2020-09-05 NOTE — Progress Notes (Signed)
Your serum osmolality is a little low and urine is fine. Look like you were actually just drinking too much water. I am seeing if I can see what your urine sodium. We do not have serum sodium from today back yet to see if improved.

## 2020-09-05 NOTE — Patient Instructions (Addendum)
Take lisinopril 20mg  daily.  Recheck Wednesday next week.   Hyponatremia Hyponatremia is when the amount of salt (sodium) in your blood is too low. When salt levels are low, your body may take in extra water. This can cause swelling throughout the body. The swelling often affects the brain. What are the causes? This condition may be caused by:  Certain medical problems or conditions.  Vomiting a lot.  Having watery poop (diarrhea) often.  Certain medicines or illegal drugs.  Not having enough water in the body (dehydration).  Drinking too much water.  Eating a diet that is low in salt.  Large burns on your body.  Too much sweating. What increases the risk? You are more likely to get this condition if you:  Have long-term (chronic) kidney disease.  Have heart failure.  Have a medical condition that causes you to have watery poop often.  Do very hard exercises.  Take medicines that affect the amount of salt is in your blood. What are the signs or symptoms? Symptoms of this condition include:  Headache.  Feeling like you may vomit (nausea).  Vomiting.  Being very tired (lethargic).  Muscle weakness and cramps.  Not wanting to eat as much as normal (loss of appetite).  Feeling weak or light-headed. Severe symptoms of this condition include:  Confusion.  Feeling restless (agitation).  Having a fast heart rate.  Passing out (fainting).  Seizures.  Coma. How is this treated? Treatment for this condition depends on the cause. Treatment may include:  Getting fluids through an IV tube that is put into one of your veins.  Taking medicines to fix the salt levels in your blood. If medicines are causing the problem, your medicines will need to be changed.  Limiting how much water or fluid you take in.  Monitoring in the hospital to watch your symptoms.   Follow these instructions at home:  Take over-the-counter and prescription medicines only as told by  your doctor. Many medicines can make this condition worse. Talk with your doctor about any medicines that you are taking.  Eat and drink exactly as you are told by your doctor. ? Eat only the foods you are told to eat. ? Limit how much fluid you take.  Do not drink alcohol.  Keep all follow-up visits as told by your doctor. This is important.   Contact a doctor if:  You feel more like you may vomit.  You feel more tired.  Your headache gets worse.  You feel more confused.  You feel weaker.  Your symptoms go away and then they come back.  You have trouble following the diet instructions. Get help right away if:  You have a seizure.  You pass out.  You keep having watery poop.  You keep vomiting. Summary  Hyponatremia is when the amount of salt in your blood is too low.  When salt levels are low, you can have swelling throughout the body. The swelling mostly affects the brain.  Treatment depends on the cause. Treatment may include getting IV fluids, medicines, or not drinking as much fluid. This information is not intended to replace advice given to you by your health care provider. Make sure you discuss any questions you have with your health care provider. Document Revised: 09/14/2018 Document Reviewed: 06/01/2018 Elsevier Patient Education  2021 [employer].

## 2020-09-07 LAB — BASIC METABOLIC PANEL
BUN: 9 mg/dL (ref 7–25)
CO2: 27 mmol/L (ref 20–32)
Calcium: 10.3 mg/dL (ref 8.6–10.4)
Chloride: 99 mmol/L (ref 98–110)
Creat: 0.66 mg/dL (ref 0.50–0.99)
Glucose, Bld: 98 mg/dL (ref 65–139)
Potassium: 4.3 mmol/L (ref 3.5–5.3)
Sodium: 134 mmol/L — ABNORMAL LOW (ref 135–146)

## 2020-09-07 LAB — BRAIN NATRIURETIC PEPTIDE: Brain Natriuretic Peptide: 15 pg/mL (ref <100)

## 2020-09-07 LAB — OSMOLALITY: Osmolality: 273 mOsm/kg — ABNORMAL LOW (ref 278–305)

## 2020-09-08 NOTE — Progress Notes (Signed)
Delorise,   Your BNP is normal this is a negative predictor of any heart failure. Which is great news.  Your serum osmolality is improving and almost in normal range with cutting back on drinking so much fluid. Continue with increasing salt and limiting your hydration to 35-42oz a day for now.  Your sodium is so much better and should be making you feel so much better.  Will recheck again on Wednesday.

## 2020-09-09 ENCOUNTER — Encounter: Payer: Self-pay | Admitting: Physician Assistant

## 2020-09-09 ENCOUNTER — Ambulatory Visit (INDEPENDENT_AMBULATORY_CARE_PROVIDER_SITE_OTHER): Payer: Medicare HMO

## 2020-09-09 ENCOUNTER — Other Ambulatory Visit: Payer: Self-pay

## 2020-09-09 DIAGNOSIS — R0989 Other specified symptoms and signs involving the circulatory and respiratory systems: Secondary | ICD-10-CM

## 2020-09-09 DIAGNOSIS — I6523 Occlusion and stenosis of bilateral carotid arteries: Secondary | ICD-10-CM | POA: Diagnosis not present

## 2020-09-09 DIAGNOSIS — I10 Essential (primary) hypertension: Secondary | ICD-10-CM | POA: Diagnosis not present

## 2020-09-09 DIAGNOSIS — E782 Mixed hyperlipidemia: Secondary | ICD-10-CM | POA: Diagnosis not present

## 2020-09-09 LAB — OSMOLALITY, URINE: Osmolality, Ur: 72 mOsm/kg (ref 50–1200)

## 2020-09-09 LAB — SODIUM, URINE, RANDOM: Sodium, Ur: 14 mmol/L — ABNORMAL LOW (ref 28–272)

## 2020-09-09 NOTE — Progress Notes (Signed)
No significant stenosis or plaque accumulation in carotid arteries. Great news you do need to start your lipitor once you start feeling better. Hopefully at next follow up.

## 2020-09-10 ENCOUNTER — Ambulatory Visit (INDEPENDENT_AMBULATORY_CARE_PROVIDER_SITE_OTHER): Payer: Medicare HMO | Admitting: Physician Assistant

## 2020-09-10 ENCOUNTER — Other Ambulatory Visit: Payer: Self-pay

## 2020-09-10 ENCOUNTER — Encounter: Payer: Self-pay | Admitting: Physician Assistant

## 2020-09-10 VITALS — BP 166/66 | HR 102 | Ht 61.0 in | Wt 144.0 lb

## 2020-09-10 DIAGNOSIS — E876 Hypokalemia: Secondary | ICD-10-CM | POA: Diagnosis not present

## 2020-09-10 DIAGNOSIS — I10 Essential (primary) hypertension: Secondary | ICD-10-CM

## 2020-09-10 LAB — BASIC METABOLIC PANEL WITH GFR
BUN: 9 mg/dL (ref 7–25)
CO2: 26 mmol/L (ref 20–32)
Calcium: 10.2 mg/dL (ref 8.6–10.4)
Chloride: 98 mmol/L (ref 98–110)
Creat: 0.78 mg/dL (ref 0.50–0.99)
GFR, Est African American: 91 mL/min/{1.73_m2} (ref >=60)
GFR, Est Non African American: 78 mL/min/{1.73_m2} (ref >=60)
Glucose, Bld: 93 mg/dL (ref 65–139)
Potassium: 4.2 mmol/L (ref 3.5–5.3)
Sodium: 134 mmol/L — ABNORMAL LOW (ref 135–146)

## 2020-09-10 MED ORDER — LISINOPRIL 20 MG PO TABS: 20.0000 mg | ORAL_TABLET | Freq: Every day | ORAL | 0 refills | Status: DC

## 2020-09-10 NOTE — Progress Notes (Signed)
   Subjective:    Patient ID: Theresa Herrera, female    DOB: 18-Aug-1950, 70 y.o.   MRN: [id number]  HPI  Patient is a 70 year old female with new onset hypertension and hypokalemia who presents to the clinic for follow-up.  Both hypertension and hyperkalemia seem to occur within 1 month after the Covid vaccine.  We recently rechecked her sodium and was at 134 from her previous 125. She is restricting her fluids to 32oz-40oz. She is feeling better.  She denies any chest pain, palpitations, headaches, vision changes.  She is slowly getting her energy back.  She is taking lisinopril 20 mg daily.  Her home blood pressure readings are in the low 130s over 70s.  She does admit to being anxious this morning and when she goes to healthcare providers.  .. Active Ambulatory Problems    Diagnosis Date Noted  . 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 Essential hypertension, benign 09/24/2015  . Palpitations 09/24/2015  . 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 Elevated vitamin B12 level 09/26/2015  . Hyperlipidemia 09/26/2015  . Melanoma of skin ([place]) 10/04/2017  . Family history of MTHFR deficiency 12/12/2017  . History of melanoma 12/12/2017  . MTHFR mutation 12/30/2017  . Uncontrolled hypertension 09/02/2020  . SOB (shortness of breath) on exertion 09/03/2020  . Hyponatremia 09/03/2020  . Left carotid bruit 09/03/2020  . Type O blood, Rh negative 09/05/2020   Resolved Ambulatory Problems    Diagnosis Date Noted  . No Resolved Ambulatory Problems   Past Medical History:  Diagnosis Date  . Hypertension        Review of Systems  All other systems reviewed and are negative.     Objective:   Physical Exam Vitals reviewed.  Constitutional:      Appearance: Normal appearance.  HENT:     Head: Normocephalic.  Neck:     Vascular: No carotid bruit.  Cardiovascular:     Rate and Rhythm: Normal rate and regular rhythm.     Pulses: Normal pulses.  Pulmonary:     Effort: Pulmonary effort is normal.      Breath sounds: Normal breath sounds.  Musculoskeletal:     Right lower leg: No edema.     Left lower leg: No edema.  Neurological:     General: No focal deficit present.     Mental Status: She is alert and oriented to person, place, and time.  Psychiatric:        Mood and Affect: Mood normal.           Assessment & Plan:  Marland KitchenMarland KitchenBernard was seen today for follow-up.  Diagnoses and all orders for this visit:  Hypokalemia -     BASIC METABOLIC PANEL WITH GFR  Essential hypertension, benign -     BASIC METABOLIC PANEL WITH GFR -     lisinopril (ZESTRIL) 20 MG tablet; Take 1 tablet (20 mg total) by mouth daily.   Patient appears to be doing much better.  Her sodium was almost in normal range at last recheck.  We will recheck again today.  She is keeping to 32 to 40 ounces of fluid a day.  She will stay on lisinopril 20 mg daily.  She will aim to keep her home readings in the 130s over 70s.  Follow-up in 3 months.

## 2020-09-10 NOTE — Progress Notes (Signed)
Home blood pressure this AM 132/78

## 2020-09-11 NOTE — Progress Notes (Signed)
Serum sodium is stable from last check. At that level you are not symptomatic. Continue to keep some salt in diet and not drink over 32 to 40oz a day. Follow up in 3 months unless you start feeling bad again or have new symptoms.

## 2020-09-12 ENCOUNTER — Encounter: Payer: Self-pay | Admitting: Physician Assistant

## 2020-09-24 ENCOUNTER — Other Ambulatory Visit: Payer: Self-pay | Admitting: Physician Assistant

## 2020-09-24 DIAGNOSIS — I10 Essential (primary) hypertension: Secondary | ICD-10-CM

## 2020-09-26 ENCOUNTER — Ambulatory Visit: Payer: Medicare HMO | Admitting: Physician Assistant

## 2020-10-03 ENCOUNTER — Other Ambulatory Visit (HOSPITAL_BASED_OUTPATIENT_CLINIC_OR_DEPARTMENT_OTHER): Payer: Medicare HMO

## 2020-10-08 ENCOUNTER — Other Ambulatory Visit: Payer: Medicare HMO

## 2020-10-08 ENCOUNTER — Ambulatory Visit: Payer: Medicare HMO

## 2020-10-31 ENCOUNTER — Encounter: Payer: Self-pay | Admitting: Physician Assistant

## 2020-11-03 LAB — COLOGUARD

## 2020-11-10 LAB — COLOGUARD

## 2020-12-02 ENCOUNTER — Other Ambulatory Visit: Payer: Self-pay | Admitting: Physician Assistant

## 2020-12-02 DIAGNOSIS — I10 Essential (primary) hypertension: Secondary | ICD-10-CM

## 2020-12-11 ENCOUNTER — Ambulatory Visit (INDEPENDENT_AMBULATORY_CARE_PROVIDER_SITE_OTHER): Payer: Medicare HMO | Admitting: Family Medicine

## 2020-12-11 DIAGNOSIS — Z1231 Encounter for screening mammogram for malignant neoplasm of breast: Secondary | ICD-10-CM | POA: Diagnosis not present

## 2020-12-11 DIAGNOSIS — Z780 Asymptomatic menopausal state: Secondary | ICD-10-CM

## 2020-12-11 DIAGNOSIS — Z0000 Encounter for general adult medical examination without abnormal findings: Secondary | ICD-10-CM | POA: Diagnosis not present

## 2020-12-11 DIAGNOSIS — Z1211 Encounter for screening for malignant neoplasm of colon: Secondary | ICD-10-CM | POA: Diagnosis not present

## 2020-12-11 NOTE — Progress Notes (Signed)
MEDICARE ANNUAL WELLNESS VISIT  12/11/2020  Telephone Visit Disclaimer This Medicare AWV was conducted by telephone due to national recommendations for restrictions regarding the COVID-19 Pandemic (e.g. social distancing).  I verified, using two identifiers, that I am speaking with Theresa Herrera or their authorized healthcare agent. I discussed the limitations, risks, security, and privacy concerns of performing an evaluation and management service by telephone and the potential availability of an in-person appointment in the future. The patient expressed understanding and agreed to proceed.  Location of Patient: Home Location of Provider (nurse):  Provider Home.  Subjective:    Theresa Herrera is a 70 y.o. female patient of Alden Hipp, Theresa Car, PA-C who had a Medicare Annual Wellness Visit today via telephone. Christianne is Retired and lives with their spouse. she has 1 child. she reports that she is socially active and does interact with friends/family regularly. she is moderately physically active and enjoys playing the piano, walking and yardwork.  Patient Care Team: Lavada Mesi as PCP - General (Family Medicine)  Advanced Directives 12/11/2020 09/24/2015  Does Patient Have a Medical Advance Directive? Yes No  Type of Paramedic of [place];Living will -  Does patient want to make changes to medical advance directive? No - Patient declined -  Copy of [place] in Chart? No - copy requested -  Would patient like information on creating a medical advance directive? - No - patient declined information    Hospital Utilization Over the Past 12 Months: # of hospitalizations or ER visits: 0 # of surgeries: 0  Review of Systems    Patient reports that her overall health is better compared to last year.  History obtained from chart review and the patient  Patient Reported Readings (BP, Pulse, CBG, Weight, etc) none  Pain Assessment Pain :  No/denies pain     Current Medications & Allergies (verified) Allergies as of 12/11/2020      Reactions   Doxycycline Swelling   Moderna Covid-19 Vaccine [covid-19 Mrna Vacc (moderna)]    Dehydration; ?heart attack      Medication List       Accurate as of December 11, 2020  3:52 PM. If you have any questions, ask your nurse or doctor.        AMBULATORY NON FORMULARY MEDICATION Vitamin D green drink   aspirin 81 MG tablet Take 81 mg by mouth daily.   atorvastatin 40 MG tablet Commonly known as: LIPITOR Take 1 tablet (40 mg total) by mouth daily.   betamethasone dipropionate 0.05 % ointment Commonly known as: DIPROLENE Apply topically 2 (two) times daily.   lisinopril 20 MG tablet Commonly known as: ZESTRIL TAKE 1 TABLET BY MOUTH EVERY DAY   metoprolol succinate 100 MG 24 hr tablet Commonly known as: TOPROL-XL Take 1 tablet (100 mg total) by mouth daily. Take with or immediately following a meal.   mometasone 0.1 % lotion Commonly known as: ELOCON APPLY A SMALL AMOUNT TO SKIN EVERY EVENING FOR 1 WEEK OF EVERY MONTH       History (reviewed): Past Medical History:  Diagnosis Date  . Hyperlipidemia   . Hypertension    Past Surgical History:  Procedure Laterality Date  . TONSILLECTOMY  1958  . TUBAL LIGATION     Family History  Problem Relation Age of Onset  . Hypertension Father   . Hyperlipidemia Father   . Heart attack Father   . Cancer Mother   . Hyperlipidemia Mother   .  Hypertension Mother    Social History   Socioeconomic History  . Marital status: Married    Spouse name: Theresa Herrera  . Number of children: 1  . Years of education: 67  . Highest education level: 12th grade  Occupational History    Comment: Retired  Tobacco Use  . Smoking status: Never Smoker  . Smokeless tobacco: Never Used  Vaping Use  . Vaping Use: Never used  Substance and Sexual Activity  . Alcohol use: No  . Drug use: Not Currently  . Sexual activity: Never  Other Topics  Concern  . Not on file  Social History Narrative   Lives with her husband. She has one son who lives with Theresa Herrera. She enjoys yardwork, music, playing piano and decorating.   Social Determinants of Health   Financial Resource Strain: Low Risk   . Difficulty of Paying Living Expenses: Not hard at all  Food Insecurity: No Food Insecurity  . Worried About Charity fundraiser in the Last Year: Never true  . Ran Out of Food in the Last Year: Never true  Transportation Needs: No Transportation Needs  . Lack of Transportation (Medical): No  . Lack of Transportation (Non-Medical): No  Physical Activity: Sufficiently Active  . Days of Exercise per Week: 6 days  . Minutes of Exercise per Session: 30 min  Stress: No Stress Concern Present  . Feeling of Stress : Not at all  Social Connections: Moderately Integrated  . Frequency of Communication with Friends and Family: Three times a week  . Frequency of Social Gatherings with Friends and Family: Once a week  . Attends Religious Services: More than 4 times per year  . Active Member of Clubs or Organizations: No  . Attends Archivist Meetings: Never  . Marital Status: Married    Activities of Daily Living In your present state of health, do you have any difficulty performing the following activities: 12/11/2020  Hearing? N  Vision? N  Difficulty concentrating or making decisions? N  Walking or climbing stairs? N  Dressing or bathing? N  Doing errands, shopping? N  Preparing Food and eating ? N  Using the Toilet? N  In the past six months, have you accidently leaked urine? N  Do you have problems with loss of bowel control? N  Managing your Medications? N  Managing your Finances? N  Housekeeping or managing your Housekeeping? N  Some recent data might be hidden    Patient Education/ Literacy How often do you need to have someone help you when you read instructions, pamphlets, or other written materials from your doctor  or pharmacy?: 1 - Never What is the last grade level you completed in school?: 12th grade  Exercise Current Exercise Habits: Home exercise routine, Type of exercise: walking, Time (Minutes): 30, Frequency (Times/Week): 6, Weekly Exercise (Minutes/Week): 180, Intensity: Moderate, Exercise limited by: None identified  Diet Patient reports consuming 2 meals a day and 1 snack(s) a day Patient reports that her primary diet is: Regular Patient reports that she does have regular access to food.   Depression Screen PHQ 2/9 Scores 12/11/2020 08/29/2020 10/04/2017  PHQ - 2 Score 0 2 1  PHQ- 9 Score - 4 6     Fall Risk Fall Risk  12/11/2020 08/29/2020  Falls in the past year? 0 0  Number falls in past yr: 0 0  Injury with Fall? 0 0  Risk for fall due to : No Fall Risks -  Follow up  Falls evaluation completed Falls evaluation completed     Objective:  Elnor Renovato seemed alert and oriented and she participated appropriately during our telephone visit.  Blood Pressure Weight BMI  BP Readings from Last 3 Encounters:  09/10/20 (!) 166/66  09/05/20 (!) 158/67  09/02/20 (!) 176/78   Wt Readings from Last 3 Encounters:  09/10/20 144 lb (65.3 kg)  09/05/20 144 lb (65.3 kg)  09/02/20 148 lb (67.1 kg)   BMI Readings from Last 1 Encounters:  09/10/20 27.21 kg/m    *Unable to obtain current vital signs, weight, and BMI due to telephone visit type  Hearing/Vision  . Haylea did not seem to have difficulty with hearing/understanding during the telephone conversation . Reports that she has had a formal eye exam by an eye care professional within the past year . Reports that she has not had a formal hearing evaluation within the past year *Unable to fully assess hearing and vision during telephone visit type  Cognitive Function: 6CIT Screen 12/11/2020  What Year? 0 points  What month? 0 points  What time? 0 points  Count back from 20 0 points  Months in reverse 0 points  Repeat phrase 6 points   Total Score 6   (Normal:0-7, Significant for Dysfunction: >8)  Normal Cognitive Function Screening: Yes   Immunization & Health Maintenance Record Immunization History  Administered Date(s) Administered  . Moderna Sars-Covid-2 Vaccination 07/31/2020  . Tdap 10/04/2017  . Zoster Recombinat (Shingrix) 03/14/2018, 05/15/2018    Health Maintenance  Topic Date Due  . PNA vac Low Risk Adult (1 of 2 - PCV13) 08/29/2021 (Originally 10/21/2016)  . Fecal DNA (Cologuard)  09/10/2021 (Originally 10/21/2001)  . MAMMOGRAM  12/11/2021 (Originally 10/13/2019)  . DEXA SCAN  12/11/2021 (Originally 10/21/2016)  . INFLUENZA VACCINE  02/09/2021  . TETANUS/TDAP  10/05/2027  . Hepatitis C Screening  Completed  . Zoster Vaccines- Shingrix  Completed  . HPV VACCINES  Aged Out  . COVID-19 Vaccine  Discontinued       Assessment  This is a routine wellness examination for Shaunda Tipping.  Health Maintenance: Due or Overdue There are no preventive care reminders to display for this patient.  Omega Durante does not need a referral for Community Assistance: Care Management:   no Social Work:    no Prescription Assistance:  no Nutrition/Diabetes Education:  no   Plan:  Personalized Goals Goals Addressed              This Visit's Progress   .  Patient Stated (pt-stated)        12/11/2020 AWV Goal: Exercise for General Health   Patient will verbalize understanding of the benefits of increased physical activity:  Exercising regularly is important. It will improve your overall fitness, flexibility, and endurance.  Regular exercise also will improve your overall health. It can help you control your weight, reduce stress, and improve your bone density.  Over the next year, patient will increase physical activity as tolerated with a goal of at least 150 minutes of moderate physical activity per week.   You can tell that you are exercising at a moderate intensity if your heart starts beating faster and  you start breathing faster but can still hold a conversation.  Moderate-intensity exercise ideas include:  Walking 1 mile (1.6 km) in about 15 minutes  Biking  Hiking  Golfing  Dancing  Water aerobics  Patient will verbalize understanding of everyday activities that increase physical activity by providing examples like the following: ?  [place] work, such as: ? Pushing a Conservation officer, nature ? Raking and bagging leaves ? Washing your Herrera ? Pushing a stroller ? Shoveling snow ? Gardening ? Washing windows or floors  Patient will be able to explain general safety guidelines for exercising:   Before you start a new exercise program, talk with your health care provider.  Do not exercise so much that you hurt yourself, feel dizzy, or get very short of breath.  Wear comfortable clothes and wear shoes with good support.  Drink plenty of water while you exercise to prevent dehydration or heat stroke.  Work out until your breathing and your heartbeat get faster.       Personalized Health Maintenance & Screening Recommendations  Pneumococcal vaccine  Influenza vaccine Screening mammography Bone densitometry screening Colorectal cancer screening  Lung Cancer Screening Recommended: no (Low Dose CT Chest recommended if Age 73-80 years, 30 pack-year currently smoking OR have quit w/in past 15 years) Hepatitis C Screening recommended: no HIV Screening recommended: no  Advanced Directives: Written information was not prepared per patient's request.  Referrals & Orders Orders Placed This Encounter  Procedures  . DEXAScan  . Mammogram 3D SCREEN BREAST BILATERAL  . Cologuard    Follow-up Plan . Follow-up with Donella Stade, PA-C as planned . Referral for your cologuard, bone density and mammogram has been sent.  . Schedule nurse visit for your pneumonia shot, when you are ready. . Medicare wellness visit in one year.    I have personally reviewed and noted the following in the  patient's chart:   . Medical and social history . Use of alcohol, tobacco or illicit drugs  . Current medications and supplements . Functional ability and status . Nutritional status . Physical activity . Advanced directives . List of other physicians . Hospitalizations, surgeries, and ER visits in previous 12 months . Vitals . Screenings to include cognitive, depression, and falls . Referrals and appointments  In addition, I have reviewed and discussed with Theresa Herrera certain preventive protocols, quality metrics, and best practice recommendations. A written personalized care plan for preventive services as well as general preventive health recommendations is available and can be mailed to the patient at her request.      Tinnie Gens, RN  12/11/2020

## 2020-12-11 NOTE — Patient Instructions (Addendum)
[place] Maintenance Summary and Written Plan of Care  Theresa Herrera ,  Thank you for allowing me to perform your Medicare Annual Wellness Visit and for your ongoing commitment to your health.   Health Maintenance & Immunization History Health Maintenance  Topic Date Due  . PNA vac Low Risk Adult (1 of 2 - PCV13) 08/29/2021 (Originally 10/21/2016)  . Fecal DNA (Cologuard)  09/10/2021 (Originally 10/21/2001)  . MAMMOGRAM  12/11/2021 (Originally 10/13/2019)  . DEXA SCAN  12/11/2021 (Originally 10/21/2016)  . INFLUENZA VACCINE  02/09/2021  . TETANUS/TDAP  10/05/2027  . Hepatitis C Screening  Completed  . Zoster Vaccines- Shingrix  Completed  . HPV VACCINES  Aged Out  . COVID-19 Vaccine  Discontinued   Immunization History  Administered Date(s) Administered  . Moderna Sars-Covid-2 Vaccination 07/31/2020  . Tdap 10/04/2017  . Zoster Recombinat (Shingrix) 03/14/2018, 05/15/2018    These are the patient goals that we discussed: Goals Addressed              This Visit's Progress   .  Patient Stated (pt-stated)        12/11/2020 AWV Goal: Exercise for General Health   Patient will verbalize understanding of the benefits of increased physical activity:  Exercising regularly is important. It will improve your overall fitness, flexibility, and endurance.  Regular exercise also will improve your overall health. It can help you control your weight, reduce stress, and improve your bone density.  Over the next year, patient will increase physical activity as tolerated with a goal of at least 150 minutes of moderate physical activity per week.   You can tell that you are exercising at a moderate intensity if your heart starts beating faster and you start breathing faster but can still hold a conversation.  Moderate-intensity exercise ideas include:  Walking 1 mile (1.6 km) in about 15 minutes  Biking  Hiking  Golfing  Dancing  Water aerobics  Patient  will verbalize understanding of everyday activities that increase physical activity by providing examples like the following: ? Yard work, such as: ? Pushing a Conservation officer, nature ? Raking and bagging leaves ? Washing your car ? Pushing a stroller ? Shoveling snow ? Gardening ? Washing windows or floors  Patient will be able to explain general safety guidelines for exercising:   Before you start a new exercise program, talk with your health care provider.  Do not exercise so much that you hurt yourself, feel dizzy, or get very short of breath.  Wear comfortable clothes and wear shoes with good support.  Drink plenty of water while you exercise to prevent dehydration or heat stroke.  Work out until your breathing and your heartbeat get faster.         This is a list of Health Maintenance Items that are overdue or due now: Pneumococcal vaccine  Influenza vaccine Screening mammography Bone densitometry screening Colorectal cancer screening  Orders/Referrals Placed Today: Orders Placed This Encounter  Procedures  . DEXAScan    Standing Status:   Future    Standing Expiration Date:   12/11/2021    Scheduling Instructions:     Please call patient to schedule    Order Specific Question:   Reason for exam:    Answer:   Postmenopausal    Order Specific Question:   Preferred imaging location?    Answer:   [facility]  . Mammogram 3D SCREEN BREAST BILATERAL    Standing Status:   Future  Standing Expiration Date:   12/11/2021    Scheduling Instructions:     Please call patient to schedule    Order Specific Question:   Reason for Exam (SYMPTOM  OR DIAGNOSIS REQUIRED)    Answer:   breast cancer screening    Order Specific Question:   Preferred imaging location?    Answer:   [facility]  . Cologuard   (Contact our referral department at [phone] if you have not spoken with someone about your referral appointment within the next 5 days)    Follow-up  Plan . Follow-up with Donella Stade, PA-C as planned . Referral for your cologuard, bone density and mammogram has been sent.  . Schedule nurse visit for your pneumonia shot, when you are ready. . Medicare wellness visit in one year.        Bone Density Test A bone density test uses a type of X-ray to measure the amount of calcium and other minerals in a person's bones. It can measure bone density in the hip and the spine. The test is similar to having a regular X-ray. This test may also be called:  Bone densitometry.  Bone mineral density test.  Dual-energy X-ray absorptiometry (DEXA). You may have this test to:  Diagnose a condition that causes weak or thin bones (osteoporosis).  Screen you for osteoporosis.  Predict your risk for a broken bone (fracture).  Determine how well your osteoporosis treatment is working. Tell a health care provider about:  Any allergies you have.  All medicines you are taking, including vitamins, herbs, eye drops, creams, and over-the-counter medicines.  Any problems you or family members have had with anesthetic medicines.  Any blood disorders you have.  Any surgeries you have had.  Any medical conditions you have.  Whether you are pregnant or may be pregnant.  Any medical tests you have had within the past 14 days that used contrast material. What are the risks? Generally, this is a safe test. However, it does expose you to a small amount of radiation, which can slightly increase your cancer risk. What happens before the test?  Do not take any calcium supplements within the 24 hours before your test.  You will need to remove all metal jewelry, eyeglasses, removable dental appliances, and any other metal objects on your body. What happens during the test?  You will lie down on an exam table. There will be an X-ray generator below you and an imaging device above you.  Other devices, such as boxes or braces, may be used to  position your body properly for the scan.  The machine will slowly scan your body. You will need to keep very still while the machine does the scan.  The images will show up on a screen in the room. Images will be examined by a specialist after your test is finished. The procedure may vary among health care providers and hospitals.   What can I expect after the test? It is up to you to get the results of your test. Ask your health care provider, or the department that is doing the test, when your results will be ready. Summary  A bone density test is an imaging test that uses a type of X-ray to measure the amount of calcium and other minerals in your bones.  The test may be used to diagnose or screen you for a condition that causes weak or thin bones (osteoporosis), predict your risk for a broken bone (fracture), or determine  how well your osteoporosis treatment is working.  Do not take any calcium supplements within 24 hours before your test.  Ask your health care provider, or the department that is doing the test, when your results will be ready. This information is not intended to replace advice given to you by your health care provider. Make sure you discuss any questions you have with your health care provider. Document Revised: 12/13/2019 Document Reviewed: 12/13/2019 Elsevier Patient Education  2021 [place].   Colonoscopy, Adult A colonoscopy is a procedure to look at the entire large intestine. This procedure is done using a long, thin, flexible tube that has a camera on the end. You may have a colonoscopy:  As a part of normal colorectal screening.  If you have certain symptoms, such as: ? A low number of red blood cells in your blood (anemia). ? Diarrhea that does not go away. ? Pain in your abdomen. ? Blood in your stool. A colonoscopy can help screen for and diagnose medical problems, including:  Tumors.  Extra tissue that grows where mucus forms  (polyps).  Inflammation.  Areas of bleeding. Tell your health care provider about:  Any allergies you have.  All medicines you are taking, including vitamins, herbs, eye drops, creams, and over-the-counter medicines.  Any problems you or family members have had with anesthetic medicines.  Any blood disorders you have.  Any surgeries you have had.  Any medical conditions you have.  Any problems you have had with having bowel movements.  Whether you are pregnant or may be pregnant. What are the risks? Generally, this is a safe procedure. However, problems may occur, including:  Bleeding.  Damage to your intestine.  Allergic reactions to medicines given during the procedure.  Infection. This is rare. What happens before the procedure? Eating and drinking restrictions Follow instructions from your health care provider about eating or drinking restrictions, which may include:  A few days before the procedure: ? Follow a low-fiber diet. ? Avoid nuts, seeds, dried fruit, raw fruits, and vegetables.  1-3 days before the procedure: ? Eat only gelatin dessert or ice pops. ? Drink only clear liquids, such as water, clear juice, clear broth or bouillon, black coffee or tea, or clear soft drinks or sports drinks. ? Avoid liquids that contain red or purple dye.  The day of the procedure: ? Do not eat solid foods. You may continue to drink clear liquids until up to 2 hours before the procedure. ? Do not eat or drink anything starting 2 hours before the procedure, or within the time period that your health care provider recommends. Bowel prep If you were prescribed a bowel prep to take by mouth (orally) to clean out your colon:  Take it as told by your health care provider. Starting the day before your procedure, you will need to drink a large amount of liquid medicine. The liquid will cause you to have many bowel movements of loose stool until your stool becomes almost clear or  light green.  If your skin or the opening between the buttocks (anus) gets irritated from diarrhea, you may relieve the irritation using: ? Wipes with medicine in them, such as adult wet wipes with aloe and vitamin E. ? A product to soothe skin, such as petroleum jelly.  If you vomit while drinking the bowel prep: ? Take a break for up to 60 minutes. ? Begin the bowel prep again. ? Call your health care provider if you keep vomiting  or you cannot take the bowel prep without vomiting.  To clean out your colon, you may also be given: ? Laxative medicines. These help you have a bowel movement. ? Instructions for enema use. An enema is liquid medicine injected into your rectum. Medicines Ask your health care provider about:  Changing or stopping your regular medicines or supplements. This is especially important if you are taking iron supplements, diabetes medicines, or blood thinners.  Taking medicines such as aspirin and ibuprofen. These medicines can thin your blood. Do not take these medicines unless your health care provider tells you to take them.  Taking over-the-counter medicines, vitamins, herbs, and supplements. General instructions  Ask your health care provider what steps will be taken to help prevent infection. These may include washing skin with a germ-killing soap.  Plan to have someone take you home from the hospital or clinic. What happens during the procedure?  An IV will be inserted into one of your veins.  You may be given one or more of the following: ? A medicine to help you relax (sedative). ? A medicine to numb the area (local anesthetic). ? A medicine to make you fall asleep (general anesthetic). This is rarely needed.  You will lie on your side with your knees bent.  The tube will: ? Have oil or gel put on it (be lubricated). ? Be inserted into your anus. ? Be gently eased through all parts of your large intestine.  Air will be sent into your colon to  keep it open. This may cause some pressure or cramping.  Images will be taken with the camera and will appear on a screen.  A small tissue sample may be removed to be looked at under a microscope (biopsy). The tissue may be sent to a lab for testing if any signs of problems are found.  If small polyps are found, they may be removed and checked for cancer cells.  When the procedure is finished, the tube will be removed. The procedure may vary among health care providers and hospitals.   What happens after the procedure?  Your blood pressure, heart rate, breathing rate, and blood oxygen level will be monitored until you leave the hospital or clinic.  You may have a small amount of blood in your stool.  You may pass gas and have mild cramping or bloating in your abdomen. This is caused by the air that was used to open your colon during the exam.  Do not drive for 24 hours after the procedure.  It is up to you to get the results of your procedure. Ask your health care provider, or the department that is doing the procedure, when your results will be ready. Summary  A colonoscopy is a procedure to look at the entire large intestine.  Follow instructions from your health care provider about eating and drinking before the procedure.  If you were prescribed an oral bowel prep to clean out your colon, take it as told by your health care provider.  During the colonoscopy, a flexible tube with a camera on its end is inserted into the anus and then passed into the other parts of the large intestine. This information is not intended to replace advice given to you by your health care provider. Make sure you discuss any questions you have with your health care provider. Document Revised: 01/19/2019 Document Reviewed: 01/19/2019 Elsevier Patient Education  [place] Maintenance, Female Adopting a healthy lifestyle and  getting preventive care are important in promoting health  and wellness. Ask your health care provider about:  The right schedule for you to have regular tests and exams.  Things you can do on your own to prevent diseases and keep yourself healthy. What should I know about diet, weight, and exercise? Eat a healthy diet  Eat a diet that includes plenty of vegetables, fruits, low-fat dairy products, and lean protein.  Do not eat a lot of foods that are high in solid fats, added sugars, or sodium.   Maintain a healthy weight Body mass index (BMI) is used to identify weight problems. It estimates body fat based on height and weight. Your health care provider can help determine your BMI and help you achieve or maintain a healthy weight. Get regular exercise Get regular exercise. This is one of the most important things you can do for your health. Most adults should:  Exercise for at least 150 minutes each week. The exercise should increase your heart rate and make you sweat (moderate-intensity exercise).  Do strengthening exercises at least twice a week. This is in addition to the moderate-intensity exercise.  Spend less time sitting. Even light physical activity can be beneficial. Watch cholesterol and blood lipids Have your blood tested for lipids and cholesterol at 69 years of age, then have this test every 5 years. Have your cholesterol levels checked more often if:  Your lipid or cholesterol levels are high.  You are older than 69 years of age.  You are at high risk for heart disease. What should I know about cancer screening? Depending on your health history and family history, you may need to have cancer screening at various ages. This may include screening for:  Breast cancer.  Cervical cancer.  Colorectal cancer.  Skin cancer.  Lung cancer. What should I know about heart disease, diabetes, and high blood pressure? Blood pressure and heart disease  High blood pressure causes heart disease and increases the risk of stroke. This  is more likely to develop in people who have high blood pressure readings, are of African descent, or are overweight.  Have your blood pressure checked: ? Every 3-5 years if you are 66-40 years of age. ? Every year if you are 61 years old or older. Diabetes Have regular diabetes screenings. This checks your fasting blood sugar level. Have the screening done:  Once every three years after age 37 if you are at a normal weight and have a low risk for diabetes.  More often and at a younger age if you are overweight or have a high risk for diabetes. What should I know about preventing infection? Hepatitis B If you have a higher risk for hepatitis B, you should be screened for this virus. Talk with your health care provider to find out if you are at risk for hepatitis B infection. Hepatitis C Testing is recommended for:  Everyone born from [birth year] through 1964.  Anyone with known risk factors for hepatitis C. Sexually transmitted infections (STIs)  Get screened for STIs, including gonorrhea and chlamydia, if: ? You are sexually active and are younger than 69 years of age. ? You are older than 69 years of age and your health care provider tells you that you are at risk for this type of infection. ? Your sexual activity has changed since you were last screened, and you are at increased risk for chlamydia or gonorrhea. Ask your health care provider if you are at risk.  Ask  your health care provider about whether you are at high risk for HIV. Your health care provider may recommend a prescription medicine to help prevent HIV infection. If you choose to take medicine to prevent HIV, you should first get tested for HIV. You should then be tested every 3 months for as long as you are taking the medicine. Pregnancy  If you are about to stop having your period (premenopausal) and you may become pregnant, seek counseling before you get pregnant.  Take 400 to 800 micrograms (mcg) of folic acid every day  if you become pregnant.  Ask for birth control (contraception) if you want to prevent pregnancy. Osteoporosis and menopause Osteoporosis is a disease in which the bones lose minerals and strength with aging. This can result in bone fractures. If you are 23 years old or older, or if you are at risk for osteoporosis and fractures, ask your health care provider if you should:  Be screened for bone loss.  Take a calcium or vitamin D supplement to lower your risk of fractures.  Be given hormone replacement therapy (HRT) to treat symptoms of menopause. Follow these instructions at home: Lifestyle  Do not use any products that contain nicotine or tobacco, such as cigarettes, e-cigarettes, and chewing tobacco. If you need help quitting, ask your health care provider.  Do not use street drugs.  Do not share needles.  Ask your health care provider for help if you need support or information about quitting drugs. Alcohol use  Do not drink alcohol if: ? Your health care provider tells you not to drink. ? You are pregnant, may be pregnant, or are planning to become pregnant.  If you drink alcohol: ? Limit how much you use to 0-1 drink a day. ? Limit intake if you are breastfeeding.  Be aware of how much alcohol is in your drink. In the [place] one drink equals one 12 oz bottle of beer (355 mL), one 5 oz glass of wine (148 mL), or one 1 oz glass of hard liquor (44 mL). General instructions  Schedule regular health, dental, and eye exams.  Stay current with your vaccines.  Tell your health care provider if: ? You often feel depressed. ? You have ever been abused or do not feel safe at home. Summary  Adopting a healthy lifestyle and getting preventive care are important in promoting health and wellness.  Follow your health care provider's instructions about healthy diet, exercising, and getting tested or screened for diseases.  Follow your health care provider's instructions on  monitoring your cholesterol and blood pressure. This information is not intended to replace advice given to you by your health care provider. Make sure you discuss any questions you have with your health care provider. Document Revised: 06/21/2018 Document Reviewed: 06/21/2018 Elsevier Patient Education  2021 [place] A mammogram is a low energy X-ray of the breasts that is done to check for abnormal changes. This procedure can screen for and detect any changes that may indicate breast cancer. Mammograms are regularly done on women. A man may have a mammogram if he has a lump or swelling in his breast. A mammogram can also identify other changes and variations in the breast, such as:  Inflammation of the breast tissue (mastitis).  An infected area that contains a collection of pus (abscess).  A fluid-filled sac (cyst).  Fibrocystic changes. This is when breast tissue becomes denser, which can make the tissue feel rope-like or uneven under the  skin.  Tumors that are not cancerous (benign). Tell a health care provider:  About any allergies you have.  If you have breast implants.  If you have had previous breast disease, biopsy, or surgery.  If you are breastfeeding.  If you are younger than age 63.  If you have a family history of breast cancer.  Whether you are pregnant or may be pregnant. What are the risks? Generally, this is a safe procedure. However, problems may occur, including:  Exposure to radiation. Radiation levels are very low with this test.  The results being misinterpreted.  The need for further tests.  The inability of the mammogram to detect certain cancers. What happens before the procedure?  Schedule your test about 1-2 weeks after your menstrual period if you are still menstruating. This is usually when your breasts are the least tender.  If you have had a mammogram done at a different facility in the past, get the mammogram X-rays or  have them sent to your current exam facility. The new and old images will be compared.  Wash your breasts and underarms on the day of the test.  Do not wear deodorants, perfumes, lotions, or powders anywhere on your body on the day of the test.  Remove any jewelry from your neck.  Wear clothes that you can change into and out of easily. What happens during the procedure?  You will undress from the waist up and put on a gown that opens in the front.  You will stand in front of the X-ray machine.  Each breast will be placed between two plastic or glass plates. The plates will compress your breast for a few seconds. Try to stay as relaxed as possible during the procedure. This does not cause any harm to your breasts and any discomfort you feel will be very brief.  X-rays will be taken from different angles of each breast. The procedure may vary among health care providers and hospitals.   What happens after the procedure?  The mammogram will be examined by a specialist (radiologist).  You may need to repeat certain parts of the test, depending on the quality of the images. This is commonly done if the radiologist needs a better view of the breast tissue.  You may resume your normal activities.  It is up to you to get the results of your procedure. Ask your health care provider, or the department that is doing the procedure, when your results will be ready. Summary  A mammogram is a low energy X-ray of the breasts that is done to check for abnormal changes. A man may have a mammogram if he has a lump or swelling in his breast.  If you have had a mammogram done at a different facility in the past, get the mammogram X-rays or have them sent to your current exam facility in order to compare them.  Schedule your test about 1-2 weeks after your menstrual period if you are still menstruating.  For this test, each breast will be placed between two plastic or glass plates. The plates will  compress your breast for a few seconds.  Ask when your test results will be ready. Make sure you get your test results. This information is not intended to replace advice given to you by your health care provider. Make sure you discuss any questions you have with your health care provider. Document Revised: 02/16/2018 Document Reviewed: 02/16/2018 Elsevier Patient Education  [place].

## 2020-12-12 ENCOUNTER — Other Ambulatory Visit: Payer: Self-pay

## 2020-12-12 ENCOUNTER — Encounter: Payer: Self-pay | Admitting: Physician Assistant

## 2020-12-12 ENCOUNTER — Ambulatory Visit (INDEPENDENT_AMBULATORY_CARE_PROVIDER_SITE_OTHER): Payer: Medicare HMO | Admitting: Physician Assistant

## 2020-12-12 VITALS — BP 144/60 | HR 75 | Ht 61.0 in | Wt 136.0 lb

## 2020-12-12 DIAGNOSIS — E871 Hypo-osmolality and hyponatremia: Secondary | ICD-10-CM | POA: Diagnosis not present

## 2020-12-12 DIAGNOSIS — I10 Essential (primary) hypertension: Secondary | ICD-10-CM

## 2020-12-12 DIAGNOSIS — E782 Mixed hyperlipidemia: Secondary | ICD-10-CM | POA: Diagnosis not present

## 2020-12-12 DIAGNOSIS — R748 Abnormal levels of other serum enzymes: Secondary | ICD-10-CM | POA: Diagnosis not present

## 2020-12-12 DIAGNOSIS — E876 Hypokalemia: Secondary | ICD-10-CM | POA: Diagnosis not present

## 2020-12-12 DIAGNOSIS — R002 Palpitations: Secondary | ICD-10-CM | POA: Diagnosis not present

## 2020-12-12 DIAGNOSIS — Z13820 Encounter for screening for osteoporosis: Secondary | ICD-10-CM

## 2020-12-12 DIAGNOSIS — Z131 Encounter for screening for diabetes mellitus: Secondary | ICD-10-CM | POA: Diagnosis not present

## 2020-12-12 DIAGNOSIS — Z1231 Encounter for screening mammogram for malignant neoplasm of breast: Secondary | ICD-10-CM

## 2020-12-12 DIAGNOSIS — Z780 Asymptomatic menopausal state: Secondary | ICD-10-CM

## 2020-12-12 MED ORDER — LISINOPRIL 20 MG PO TABS: 1.0000 | ORAL_TABLET | Freq: Every day | ORAL | 1 refills | Status: DC

## 2020-12-12 NOTE — Progress Notes (Signed)
   Subjective:    Patient ID: Theresa Herrera, female    DOB: 1951-02-14, 70 y.o.   MRN: [id number]  HPI  Patient is a 70 year old female with hypertension, hyperlipidemia, hyponatremia, hypokalemia who presents to the clinic for follow-up on blood pressure and medication management.  She is feeling much better today. She admits to taking BP medications but worried about cholesterol medication. Denies any CP, palpitations, SOB, headache or vision changes. Her weakness has resolved.   .. Active Ambulatory Problems    Diagnosis Date Noted  . 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 Essential hypertension, benign 09/24/2015  . Palpitations 09/24/2015  . 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 Elevated vitamin B12 level 09/26/2015  . Hyperlipidemia 09/26/2015  . Melanoma of skin ([place]) 10/04/2017  . Family history of MTHFR deficiency 12/12/2017  . History of melanoma 12/12/2017  . MTHFR mutation 12/30/2017  . Uncontrolled hypertension 09/02/2020  . SOB (shortness of breath) on exertion 09/03/2020  . Hyponatremia 09/03/2020  . Left carotid bruit 09/03/2020  . Type O blood, Rh negative 09/05/2020  . Hypokalemia 12/15/2020   Resolved Ambulatory Problems    Diagnosis Date Noted  . No Resolved Ambulatory Problems   Past Medical History:  Diagnosis Date  . Hypertension        Review of Systems See HPI.     Objective:   Physical Exam Vitals reviewed.  Constitutional:      Appearance: Normal appearance. She is obese.  HENT:     Head: Normocephalic.  Neck:     Vascular: No carotid bruit.  Cardiovascular:     Rate and Rhythm: Normal rate and regular rhythm.     Pulses: Normal pulses.  Pulmonary:     Effort: Pulmonary effort is normal.     Breath sounds: Normal breath sounds.  Musculoskeletal:     Right lower leg: No edema.     Left lower leg: No edema.  Neurological:     General: No focal deficit present.     Mental Status: She is alert.  Psychiatric:        Mood and Affect: Mood  normal.        Behavior: Behavior normal.           Assessment & Plan:  Marland KitchenMarland KitchenAlinna was seen today for follow-up.  Diagnoses and all orders for this visit:  Hyponatremia -     BASIC METABOLIC PANEL WITH GFR  Elevated vitamin B12 level -     Vitamin B12  Encounter for screening mammogram for malignant neoplasm of breast -     MM 3D SCREEN BREAST BILATERAL; Future  Osteoporosis screening -     DG Bone Density; Future  Essential hypertension, benign -     lisinopril (ZESTRIL) 20 MG tablet; Take 1 tablet (20 mg total) by mouth daily.  Screening for diabetes mellitus  Post-menopausal -     VITAMIN D 25 Hydroxy (Vit-D Deficiency, Fractures)  Mixed hyperlipidemia -     Lipid Panel w/reflex Direct LDL  Palpitations  Hypokalemia   Need for bone density/mammogram.   Pt not taking statin. Discussed again and urged the risk of elevated LDL and CV event. She has made diet changes and would like lipid panel rechecked. Discussed if she took statin every other day, every 3 days or even once a week. She will consider.   Will recheck labs to make sure electrolytes corrected.   Follow up 6 months.

## 2020-12-13 LAB — LIPID PANEL W/REFLEX DIRECT LDL
Cholesterol: 259 mg/dL — ABNORMAL HIGH (ref <200)
HDL: 58 mg/dL (ref >=50)
LDL Cholesterol (Calc): 177 mg/dL (calc) — ABNORMAL HIGH
Non-HDL Cholesterol (Calc): 201 mg/dL (calc) — ABNORMAL HIGH (ref <130)
Total CHOL/HDL Ratio: 4.5 (calc) (ref <5.0)
Triglycerides: 114 mg/dL (ref <150)

## 2020-12-13 LAB — BASIC METABOLIC PANEL WITH GFR
BUN: 9 mg/dL (ref 7–25)
CO2: 24 mmol/L (ref 20–32)
Calcium: 10.3 mg/dL (ref 8.6–10.4)
Chloride: 102 mmol/L (ref 98–110)
Creat: 0.69 mg/dL (ref 0.50–0.99)
GFR, Est African American: 103 mL/min/{1.73_m2} (ref >=60)
GFR, Est Non African American: 89 mL/min/{1.73_m2} (ref >=60)
Glucose, Bld: 91 mg/dL (ref 65–99)
Potassium: 4.3 mmol/L (ref 3.5–5.3)
Sodium: 135 mmol/L (ref 135–146)

## 2020-12-13 LAB — VITAMIN B12: Vitamin B-12: >2000 pg/mL — ABNORMAL HIGH (ref 200–1100)

## 2020-12-13 LAB — VITAMIN D 25 HYDROXY (VIT D DEFICIENCY, FRACTURES): Vit D, 25-Hydroxy: 35 ng/mL (ref 30–100)

## 2020-12-15 ENCOUNTER — Encounter: Payer: Self-pay | Admitting: Physician Assistant

## 2020-12-15 DIAGNOSIS — E876 Hypokalemia: Secondary | ICD-10-CM | POA: Insufficient documentation

## 2020-12-15 NOTE — Progress Notes (Signed)
Sheryn,   Sodium in normal range now.  Vitamin d 35 just barely above normal range. Need to be on at least 1000 units of vitamin D a day.  LdL, bad cholesterol, is just a little better than it was 3 months ago but still very high. Your HDL, good cholesterol, remains good level.  B12 is too high. Need to cut back on b12 intake. How much are you taking?

## 2020-12-31 ENCOUNTER — Encounter: Payer: Self-pay | Admitting: Physician Assistant

## 2020-12-31 ENCOUNTER — Ambulatory Visit (INDEPENDENT_AMBULATORY_CARE_PROVIDER_SITE_OTHER): Payer: Medicare HMO

## 2020-12-31 ENCOUNTER — Other Ambulatory Visit: Payer: Self-pay

## 2020-12-31 DIAGNOSIS — Z1231 Encounter for screening mammogram for malignant neoplasm of breast: Secondary | ICD-10-CM | POA: Diagnosis not present

## 2020-12-31 DIAGNOSIS — Z13820 Encounter for screening for osteoporosis: Secondary | ICD-10-CM

## 2020-12-31 DIAGNOSIS — M810 Age-related osteoporosis without current pathological fracture: Secondary | ICD-10-CM | POA: Diagnosis not present

## 2020-12-31 NOTE — Progress Notes (Signed)
Your bone density did show that you have osteoporosis, low bone density, which makes fractures occur more easily. We should talk about medication treatment. Make sure to stay on vitamin D and calcium as well.

## 2021-01-01 NOTE — Progress Notes (Signed)
Normal mammogram. Follow up in 1 year.

## 2021-05-21 IMAGING — DX DG CHEST 2V
2 series · 2 of 2 positions shown · non-contrast
Comparison: None

CLINICAL DATA: Shortness of breath with exertion, hypertension,
palpitations, onset of symptoms after [id number] vaccine

EXAM:
CHEST - 2 VIEW

[chest pa]
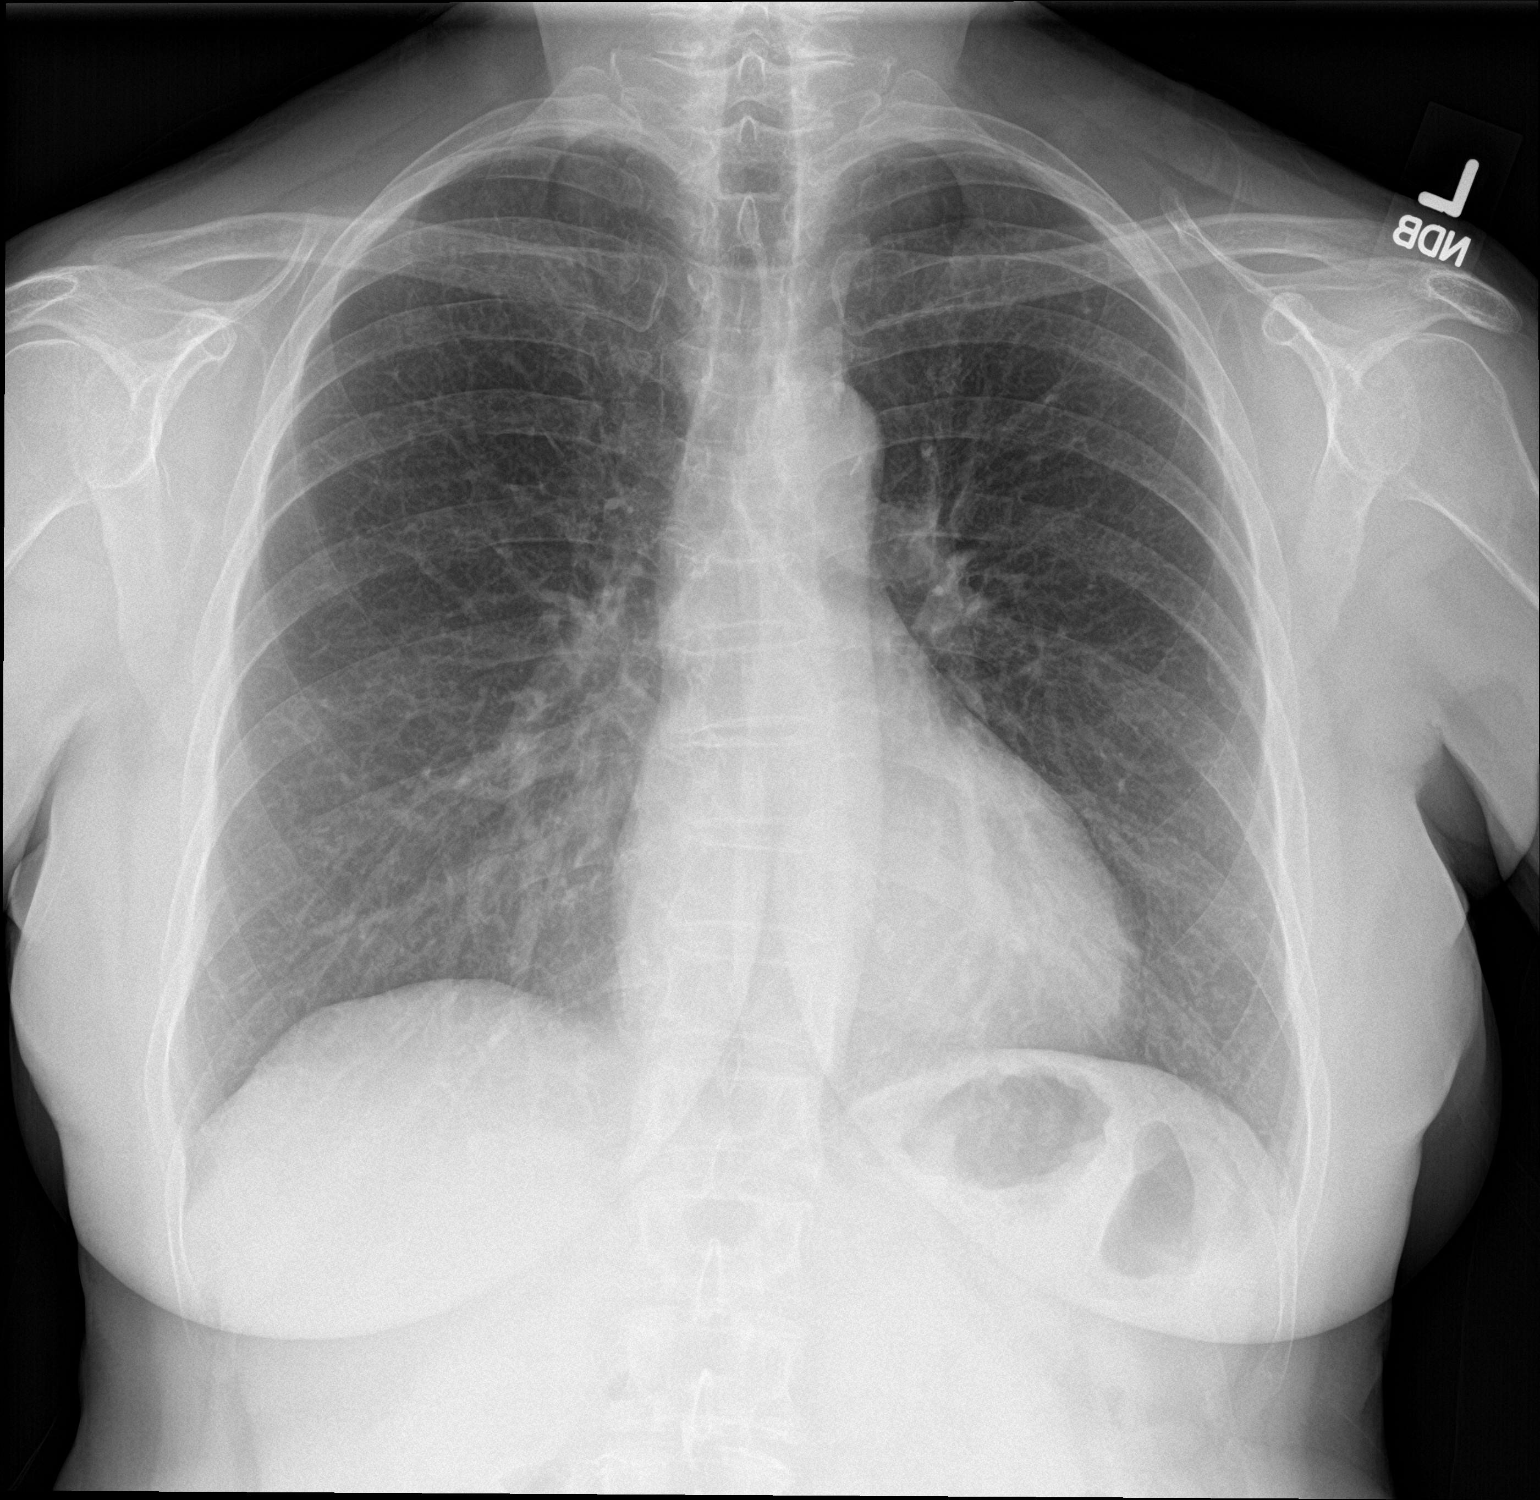

[chest lat]
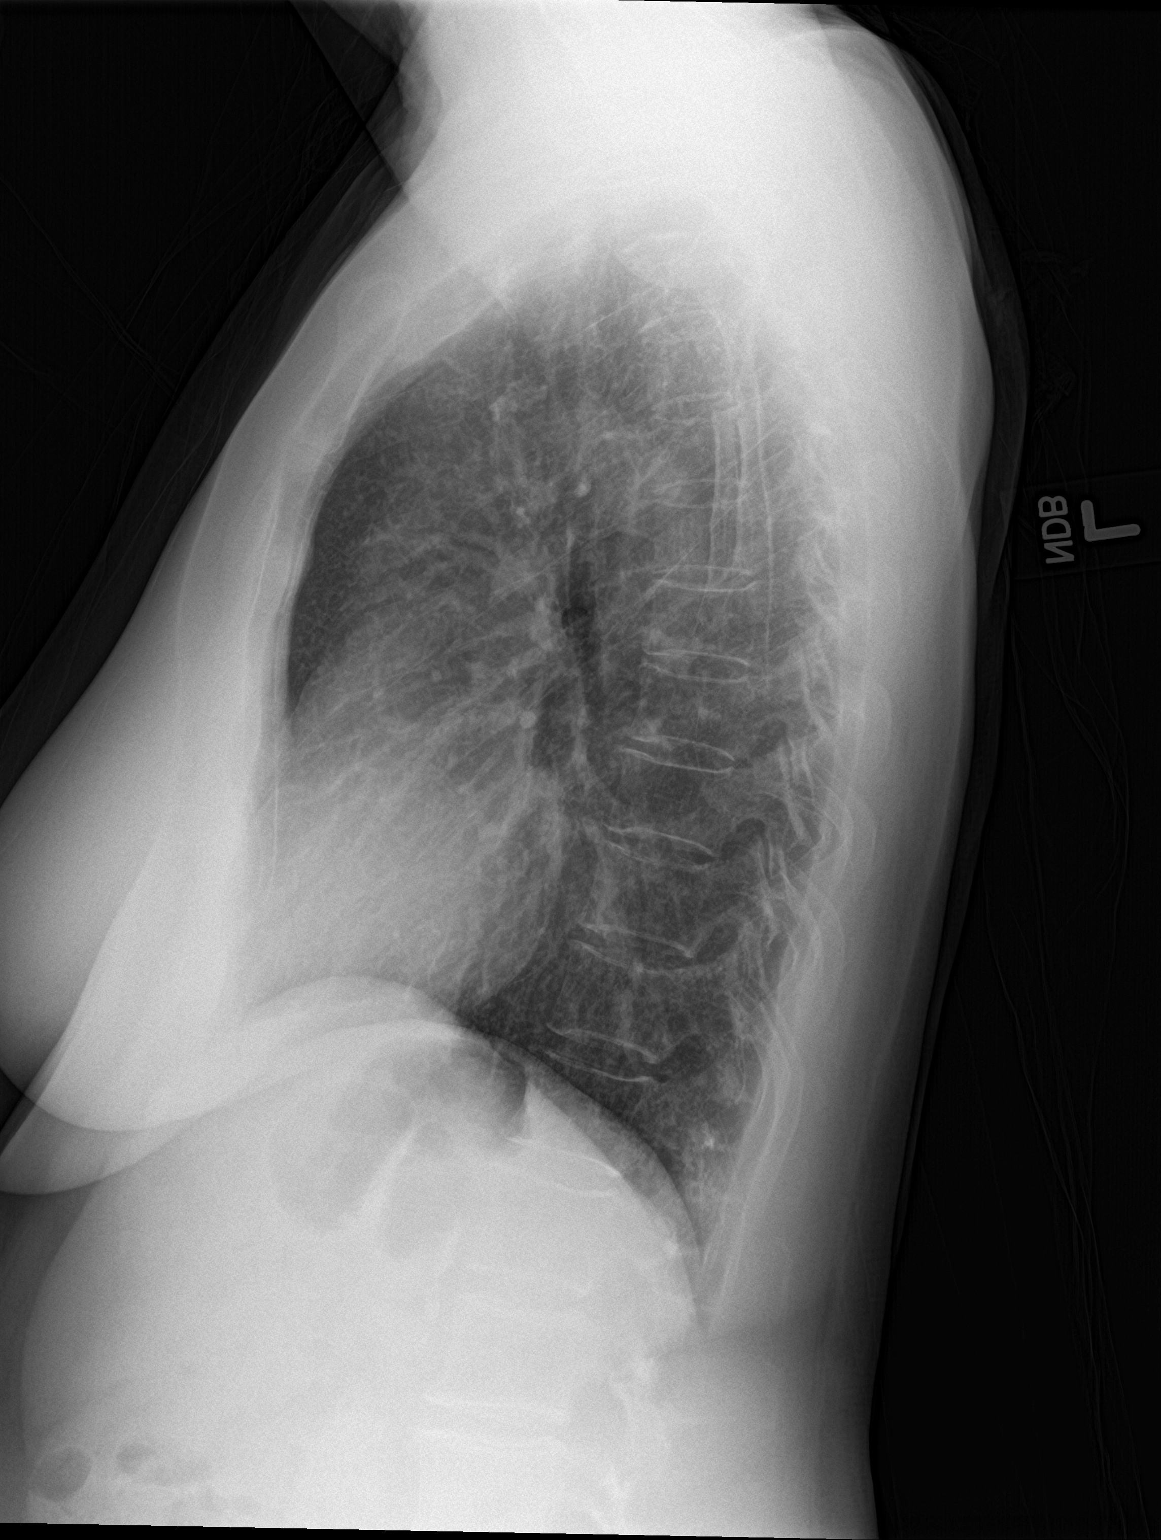

[2 of 2 positions shown; findings below may reference images not displayed]

FINDINGS: Normal heart size, mediastinal contours, and pulmonary vascularity.

Lungs clear.

No pleural effusion or pneumothorax.

Bones unremarkable.
IMPRESSION: Normal exam.

## 2021-06-15 ENCOUNTER — Ambulatory Visit: Payer: Medicare HMO | Admitting: Physician Assistant

## 2021-06-26 DIAGNOSIS — Z08 Encounter for follow-up examination after completed treatment for malignant neoplasm: Secondary | ICD-10-CM | POA: Diagnosis not present

## 2021-06-26 DIAGNOSIS — L820 Inflamed seborrheic keratosis: Secondary | ICD-10-CM | POA: Diagnosis not present

## 2021-06-26 DIAGNOSIS — Z85820 Personal history of malignant melanoma of skin: Secondary | ICD-10-CM | POA: Diagnosis not present

## 2021-06-26 DIAGNOSIS — Z85828 Personal history of other malignant neoplasm of skin: Secondary | ICD-10-CM | POA: Diagnosis not present

## 2021-06-26 DIAGNOSIS — R58 Hemorrhage, not elsewhere classified: Secondary | ICD-10-CM | POA: Diagnosis not present

## 2021-06-26 DIAGNOSIS — L538 Other specified erythematous conditions: Secondary | ICD-10-CM | POA: Diagnosis not present

## 2021-07-20 ENCOUNTER — Ambulatory Visit (INDEPENDENT_AMBULATORY_CARE_PROVIDER_SITE_OTHER): Payer: Medicare HMO | Admitting: Physician Assistant

## 2021-07-20 ENCOUNTER — Other Ambulatory Visit: Payer: Self-pay

## 2021-07-20 ENCOUNTER — Encounter: Payer: Self-pay | Admitting: Physician Assistant

## 2021-07-20 VITALS — BP 168/64 | HR 104 | Wt 129.0 lb

## 2021-07-20 DIAGNOSIS — I10 Essential (primary) hypertension: Secondary | ICD-10-CM

## 2021-07-20 DIAGNOSIS — E559 Vitamin D deficiency, unspecified: Secondary | ICD-10-CM

## 2021-07-20 DIAGNOSIS — R7989 Other specified abnormal findings of blood chemistry: Secondary | ICD-10-CM

## 2021-07-20 DIAGNOSIS — Z1211 Encounter for screening for malignant neoplasm of colon: Secondary | ICD-10-CM

## 2021-07-20 DIAGNOSIS — E782 Mixed hyperlipidemia: Secondary | ICD-10-CM

## 2021-07-20 DIAGNOSIS — R748 Abnormal levels of other serum enzymes: Secondary | ICD-10-CM | POA: Diagnosis not present

## 2021-07-20 DIAGNOSIS — E876 Hypokalemia: Secondary | ICD-10-CM | POA: Diagnosis not present

## 2021-07-20 DIAGNOSIS — Z1329 Encounter for screening for other suspected endocrine disorder: Secondary | ICD-10-CM

## 2021-07-20 MED ORDER — LISINOPRIL 20 MG PO TABS: 20.0000 mg | ORAL_TABLET | Freq: Every day | ORAL | 1 refills | Status: DC

## 2021-07-20 NOTE — Patient Instructions (Signed)

## 2021-07-20 NOTE — Progress Notes (Signed)
Subjective:    Patient ID: Theresa Herrera, female    DOB: [birth year], 71 y.o.   MRN: [id number]  HPI Pt is a 71 yo female with history of HTN and some electrolyte abnormalities who presents to the clinic for medication refills.   Pt is doing well. She denies any CP, palpitations, headaches, or vision changes. She is taking her medications daily. She is tolerating them well. She checks BP at home and getting 120s over 70s with most checks.   She started lipitor in June and wants to see how her cholesterol is doing.    Review of Systems  All other systems reviewed and are negative.     Objective:   Physical Exam Vitals reviewed.  Constitutional:      Appearance: Normal appearance.  HENT:     Head: Normocephalic.  Neck:     Vascular: No carotid bruit.  Cardiovascular:     Rate and Rhythm: Normal rate and regular rhythm.     Pulses: Normal pulses.     Heart sounds: Normal heart sounds.  Pulmonary:     Effort: Pulmonary effort is normal.     Breath sounds: Normal breath sounds.  Musculoskeletal:     Right lower leg: No edema.     Left lower leg: No edema.  Neurological:     General: No focal deficit present.     Mental Status: She is alert and oriented to person, place, and time.  Psychiatric:        Mood and Affect: Mood normal.   .. Depression screen [facility] 2/9 07/20/2021 12/11/2020 08/29/2020 10/04/2017  Decreased Interest 0 0 1 0  Down, Depressed, Hopeless 0 0 1 1  PHQ - 2 Score 0 0 2 1  Altered sleeping - - 0 0  Tired, decreased energy - - 1 1  Change in appetite - - 0 1  Feeling bad or failure about yourself  - - 1 1  Trouble concentrating - - 0 1  Moving slowly or fidgety/restless - - 0 1  Suicidal thoughts - - 0 0  PHQ-9 Score - - 4 6  Difficult doing work/chores - - Not difficult at all Not difficult at all          Assessment & Plan:  Marland KitchenMarland KitchenLatayvia was seen today for follow-up.  Diagnoses and all orders for this visit:  Primary hypertension -     COMPLETE  METABOLIC PANEL WITH GFR  Elevated vitamin B12 level -     Vitamin B12 -     Homocysteine  Mixed hyperlipidemia -     Lipid Panel w/reflex Direct LDL  Hypokalemia -     COMPLETE METABOLIC PANEL WITH GFR  Vitamin D deficiency -     VITAMIN D 25 Hydroxy (Vit-D Deficiency, Fractures)  Essential hypertension, benign -     lisinopril (ZESTRIL) 20 MG tablet; Take 1 tablet (20 mg total) by mouth daily. -     Homocysteine  White coat syndrome with hypertension -     Homocysteine  Thyroid disorder screen -     TSH  Colon cancer screening -     Cologuard   BP in office is not to goal but brings in log with readings in the 120s over 70s.  Refilled lisinopril for 6 months.  Labs ordered for screening and to follow up on lipitor and cholesterol  Encouraged pt to return cologuard. Pt thinks the kit is too old and needs new one. Order placed today.  Follow  up in 6 months.

## 2021-07-22 ENCOUNTER — Encounter: Payer: Self-pay | Admitting: Physician Assistant

## 2021-07-22 ENCOUNTER — Other Ambulatory Visit: Payer: Self-pay | Admitting: Neurology

## 2021-07-22 LAB — COMPLETE METABOLIC PANEL WITH GFR
AG Ratio: 2.2 (calc) (ref 1.0–2.5)
ALT: 30 U/L — ABNORMAL HIGH (ref 6–29)
AST: 20 U/L (ref 10–35)
Albumin: 4.9 g/dL (ref 3.6–5.1)
Alkaline phosphatase (APISO): 75 U/L (ref 37–153)
BUN: 14 mg/dL (ref 7–25)
CO2: 25 mmol/L (ref 20–32)
Calcium: 10.8 mg/dL — ABNORMAL HIGH (ref 8.6–10.4)
Chloride: 102 mmol/L (ref 98–110)
Creat: 0.71 mg/dL (ref 0.50–1.05)
Globulin: 2.2 g/dL (calc) (ref 1.9–3.7)
Glucose, Bld: 93 mg/dL (ref 65–99)
Potassium: 4.5 mmol/L (ref 3.5–5.3)
Sodium: 140 mmol/L (ref 135–146)
Total Bilirubin: 0.6 mg/dL (ref 0.2–1.2)
Total Protein: 7.1 g/dL (ref 6.1–8.1)
eGFR: 92 mL/min/{1.73_m2} (ref >=60)

## 2021-07-22 LAB — TSH: TSH: 2.39 mIU/L (ref 0.40–4.50)

## 2021-07-22 LAB — LIPID PANEL W/REFLEX DIRECT LDL
Cholesterol: 264 mg/dL — ABNORMAL HIGH (ref <200)
HDL: 65 mg/dL (ref >=50)
LDL Cholesterol (Calc): 179 mg/dL (calc) — ABNORMAL HIGH
Non-HDL Cholesterol (Calc): 199 mg/dL (calc) — ABNORMAL HIGH (ref <130)
Total CHOL/HDL Ratio: 4.1 (calc) (ref <5.0)
Triglycerides: 86 mg/dL (ref <150)

## 2021-07-22 LAB — VITAMIN D 25 HYDROXY (VIT D DEFICIENCY, FRACTURES): Vit D, 25-Hydroxy: 113 ng/mL — ABNORMAL HIGH (ref 30–100)

## 2021-07-22 LAB — HOMOCYSTEINE: Homocysteine: 10.7 umol/L — ABNORMAL HIGH (ref <10.4)

## 2021-07-22 LAB — VITAMIN B12: Vitamin B-12: 1863 pg/mL — ABNORMAL HIGH (ref 200–1100)

## 2021-07-22 NOTE — Progress Notes (Signed)
Lastacia,   Homocysteine is still not back yet.  B12 showing elevated but always going to do that due to gene mutation for you  Vitamin D a little too high. How much are you taking daily and we will back off just a tad.   Thyroid looks great Calcium just a little elevated. Need to have PTH drawn.  LDL is still very high.  HDL is great! I would still recommend starting the lipitor.

## 2021-07-24 NOTE — Progress Notes (Signed)
Homocysteine levels are still elevated but better than before.

## 2021-07-28 LAB — PARATHYROID HORMONE, INTACT (NO CA): PTH: 33 pg/mL (ref 16–77)

## 2021-07-28 NOTE — Progress Notes (Signed)
PTH normal.

## 2021-07-31 DIAGNOSIS — Z1211 Encounter for screening for malignant neoplasm of colon: Secondary | ICD-10-CM | POA: Diagnosis not present

## 2021-08-05 LAB — COLOGUARD: COLOGUARD: POSITIVE — AB

## 2021-08-07 ENCOUNTER — Other Ambulatory Visit: Payer: Self-pay | Admitting: Physician Assistant

## 2021-08-07 DIAGNOSIS — R195 Other fecal abnormalities: Secondary | ICD-10-CM

## 2021-08-07 NOTE — Progress Notes (Signed)
Your cologuard testing did come back positive. This does not mean you have colon cancer but it does been that either blood or DNA particles were detected that can be present in colon cancer. You do need colonoscopy. I will place referral.

## 2021-08-24 DIAGNOSIS — L668 Other cicatricial alopecia: Secondary | ICD-10-CM | POA: Diagnosis not present

## 2021-08-24 DIAGNOSIS — Z85820 Personal history of malignant melanoma of skin: Secondary | ICD-10-CM | POA: Diagnosis not present

## 2021-08-24 DIAGNOSIS — L578 Other skin changes due to chronic exposure to nonionizing radiation: Secondary | ICD-10-CM | POA: Diagnosis not present

## 2021-08-24 DIAGNOSIS — L821 Other seborrheic keratosis: Secondary | ICD-10-CM | POA: Diagnosis not present

## 2021-08-24 DIAGNOSIS — D225 Melanocytic nevi of trunk: Secondary | ICD-10-CM | POA: Diagnosis not present

## 2021-08-24 DIAGNOSIS — Z08 Encounter for follow-up examination after completed treatment for malignant neoplasm: Secondary | ICD-10-CM | POA: Diagnosis not present

## 2021-08-24 DIAGNOSIS — Z85828 Personal history of other malignant neoplasm of skin: Secondary | ICD-10-CM | POA: Diagnosis not present

## 2021-09-08 LAB — FECAL OCCULT BLOOD, IMMUNOCHEMICAL: IFOBT: NEGATIVE

## 2021-09-17 IMAGING — MG MM DIGITAL SCREENING BILAT W/ TOMO AND CAD
8 series · 8 of 24 positions shown · non-contrast
Comparison: Previous exam(s).

CLINICAL DATA: Screening.

EXAM:
DIGITAL SCREENING BILATERAL MAMMOGRAM WITH TOMOSYNTHESIS AND CAD
TECHNIQUE: Bilateral screening digital craniocaudal and mediolateral oblique
mammograms were obtained. Bilateral screening digital breast
tomosynthesis was performed. The images were evaluated with
computer-aided detection.

[L MLO synth-2D]
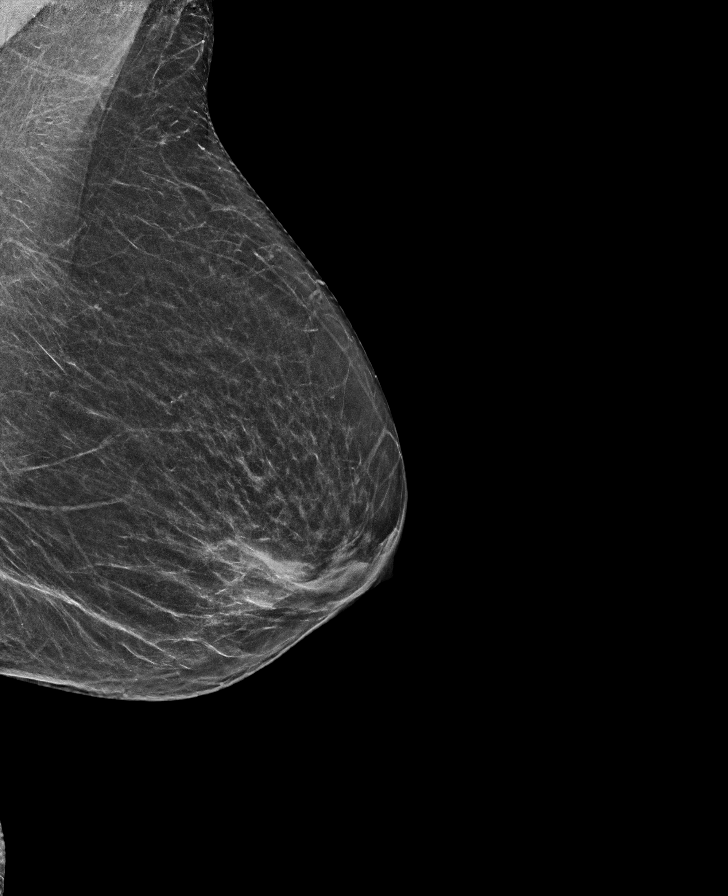

[R MLO synth-2D]
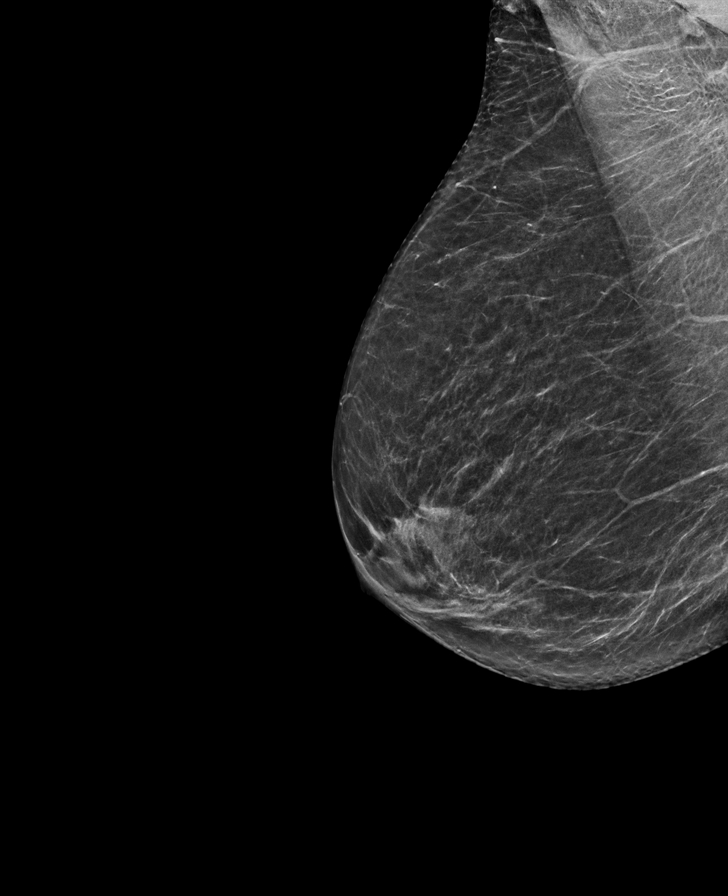

[L CC synth-2D]
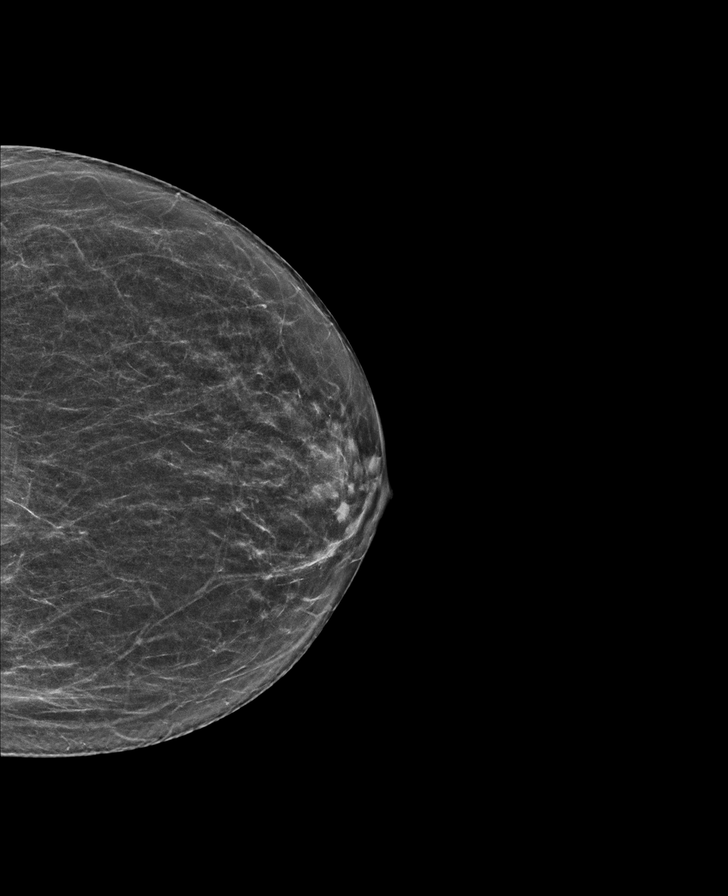

[R CC synth-2D]
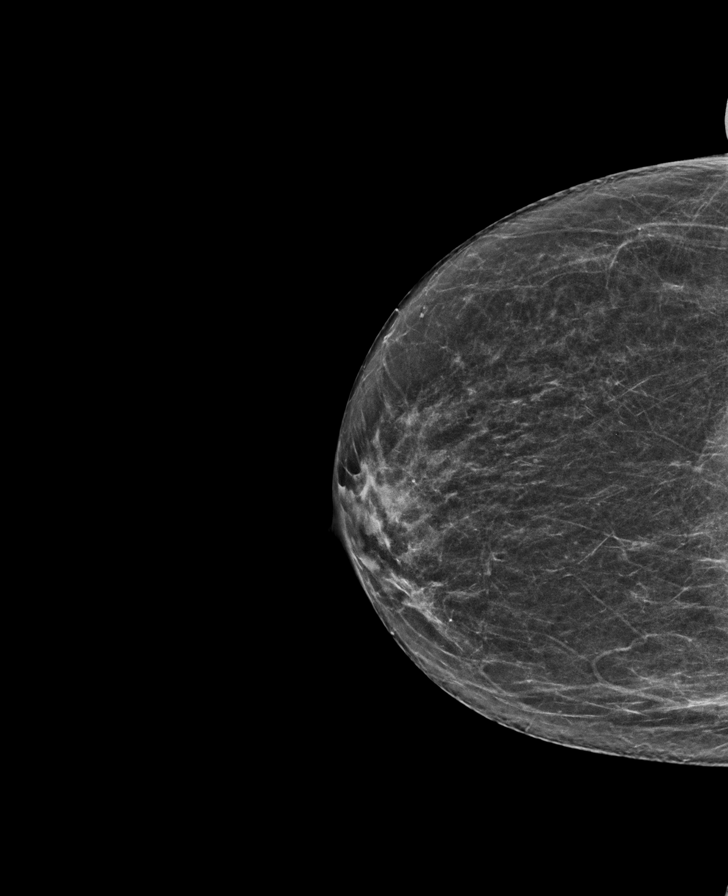

[L CC tomo · tomo slice 29/56.0]
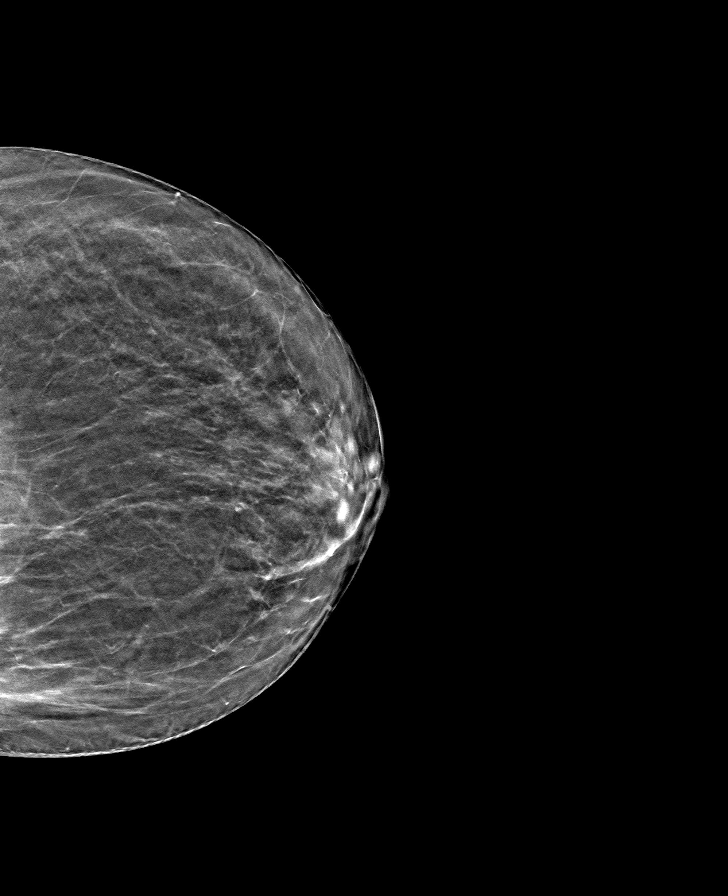

[R CC tomo · tomo slice 30/59.0]
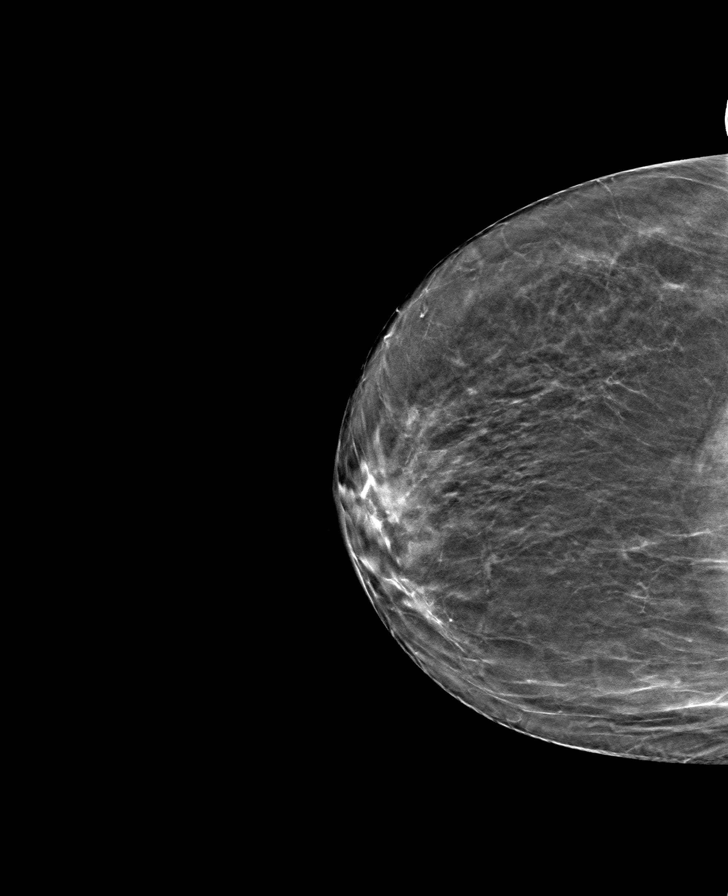

[L MLO tomo · tomo slice 31/60.0]
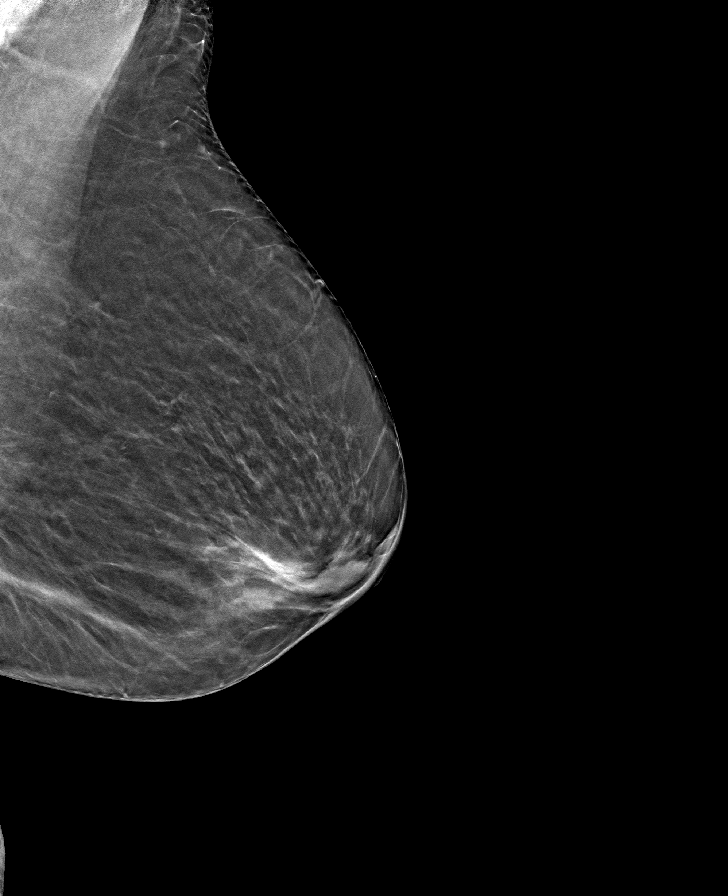

[R MLO tomo · tomo slice 32/63.0]
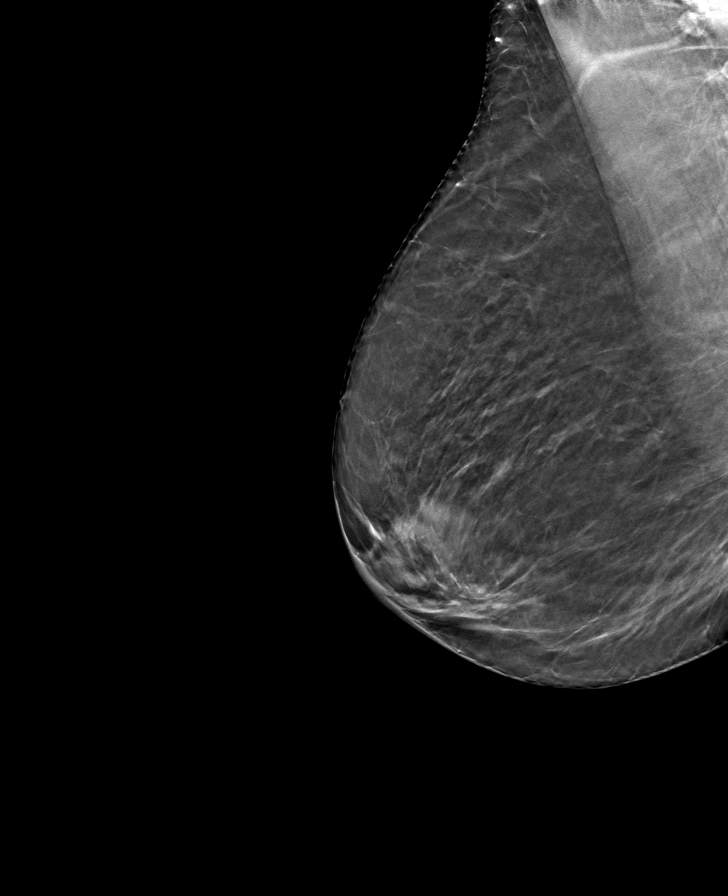

[8 of 24 positions shown; findings below may reference images not displayed]

ACR Breast Density Category b: There are scattered areas of
fibroglandular density.
FINDINGS: There are no findings suspicious for malignancy.
IMPRESSION: No mammographic evidence of malignancy. A result letter of this
screening mammogram will be mailed directly to the patient.

RECOMMENDATION:
Screening mammogram in one year. (Code:51-O-LD2)

BI-RADS CATEGORY  1: Negative.

## 2021-09-19 ENCOUNTER — Other Ambulatory Visit: Payer: Self-pay | Admitting: Physician Assistant

## 2021-09-19 DIAGNOSIS — R002 Palpitations: Secondary | ICD-10-CM

## 2021-09-19 DIAGNOSIS — I10 Essential (primary) hypertension: Secondary | ICD-10-CM

## 2021-09-23 ENCOUNTER — Encounter: Payer: Medicare HMO | Admitting: Gastroenterology

## 2021-10-29 ENCOUNTER — Encounter: Payer: Medicare HMO | Admitting: Gastroenterology

## 2021-11-02 DIAGNOSIS — H524 Presbyopia: Secondary | ICD-10-CM | POA: Diagnosis not present

## 2021-11-05 DIAGNOSIS — Z0100 Encounter for examination of eyes and vision without abnormal findings: Secondary | ICD-10-CM | POA: Diagnosis not present

## 2021-12-14 ENCOUNTER — Ambulatory Visit (INDEPENDENT_AMBULATORY_CARE_PROVIDER_SITE_OTHER): Payer: Medicare HMO | Admitting: Physician Assistant

## 2021-12-14 VITALS — BP 120/70 | HR 70 | Ht 61.5 in | Wt 128.0 lb

## 2021-12-14 DIAGNOSIS — Z0000 Encounter for general adult medical examination without abnormal findings: Secondary | ICD-10-CM | POA: Diagnosis not present

## 2021-12-14 NOTE — Progress Notes (Signed)
MEDICARE ANNUAL WELLNESS VISIT  12/14/2021  Telephone Visit Disclaimer This Medicare AWV was conducted by telephone due to national recommendations for restrictions regarding the COVID-19 Pandemic (e.g. social distancing).  I verified, using two identifiers, that I am speaking with [name] or their authorized healthcare agent. I discussed the limitations, risks, security, and privacy concerns of performing an evaluation and management service by telephone and the potential availability of an in-person appointment in the future. The patient expressed understanding and agreed to proceed.  Location of Patient: Home Location of Provider (nurse):  In the office.  Subjective:    Theresa Herrera is a 71 y.o. female patient of Alden Hipp, Royetta Car, PA-C who had a Medicare Annual Wellness Visit today via telephone. [name] is Retired and lives with their spouse. she has 1 child. she reports that she is socially active and does interact with friends/family regularly. she is moderately physically active and enjoys yardwork, art and playing the piano.  Patient Care Team: Lavada Mesi as PCP - General (Family Medicine)     12/14/2021    9:08 AM 12/11/2020    3:13 PM 09/24/2015    9:31 AM  Advanced Directives  Does Patient Have a Medical Advance Directive? Yes Yes No  Type of Advance Directive Living will [place];Living will   Does patient want to make changes to medical advance directive? No - Patient declined No - Patient declined   Copy of [place] in Chart?  No - copy requested   Would patient like information on creating a medical advance directive?   No - patient declined information    Hospital Utilization Over the Past 12 Months: # of hospitalizations or ER visits: 0 # of surgeries: 0  Review of Systems    Patient reports that her overall health is better compared to last year.  History obtained from chart review and the patient  Patient  Reported Readings (BP, Pulse, CBG, Weight, etc) BP: 120/70 Pulse: 70 Weight: 128 lb Height: 52f.5  Pain Assessment Pain : No/denies pain     Current Medications & Allergies (verified) Allergies as of 12/14/2021       Reactions   Doxycycline Swelling   Moderna Covid-19 Vaccine [covid-19 Mrna Vacc (moderna)]    Dehydration; ?heart attack        Medication List        Accurate as of December 14, 2021  9:34 AM. If you have any questions, ask your nurse or doctor.          AMBULATORY NON FORMULARY MEDICATION Vitamin D green drink   aspirin 81 MG tablet Take 81 mg by mouth daily. Doesn't take it everyday   atorvastatin 40 MG tablet Commonly known as: LIPITOR TAKE 1 TABLET BY MOUTH EVERY DAY What changed:  how much to take how to take this when to take this additional instructions   betamethasone dipropionate 0.05 % ointment Commonly known as: DIPROLENE Apply topically 2 (two) times daily.   lisinopril 20 MG tablet Commonly known as: ZESTRIL Take 1 tablet (20 mg total) by mouth daily. What changed: additional instructions   metoprolol succinate 100 MG 24 hr tablet Commonly known as: TOPROL-XL TAKE 1 TABLET BY MOUTH DAILY. TAKE WITH OR IMMEDIATELY FOLLOWING A MEAL. What changed:  how much to take how to take this when to take this additional instructions   mometasone 0.1 % lotion Commonly known as: EWestonName: MMechele Dawley-  liposomal bone health liquid drink        History (reviewed): Past Medical History:  Diagnosis Date   Hyperlipidemia    Hypertension    Past Surgical History:  Procedure Laterality Date   TONSILLECTOMY  1958   TUBAL LIGATION     Family History  Problem Relation Age of Onset   Hypertension Father    Hyperlipidemia Father    Heart attack Father    Cancer Mother    Hyperlipidemia Mother    Hypertension Mother    Social History   Socioeconomic History   Marital status: Married    Spouse name: Theresa Herrera    Number of children: 1   Years of education: 12   Highest education level: 12th grade  Occupational History    Comment: Retired  Tobacco Use   Smoking status: Never   Smokeless tobacco: Never  Vaping Use   Vaping Use: Never used  Substance and Sexual Activity   Alcohol use: No   Drug use: Not Currently   Sexual activity: Never  Other Topics Concern   Not on file  Social History Narrative   Lives with her husband. She has one son who lives with [place]. She enjoys yardwork, music, playing piano and decorating.   Social Determinants of Health   Financial Resource Strain: Low Risk    Difficulty of Paying Living Expenses: Not hard at all  Food Insecurity: No Food Insecurity   Worried About Charity fundraiser in the Last Year: Never true   Old Agency in the Last Year: Never true  Transportation Needs: No Transportation Needs   Lack of Transportation (Medical): No   Lack of Transportation (Non-Medical): No  Physical Activity: Sufficiently Active   Days of Exercise per Week: 5 days   Minutes of Exercise per Session: 30 min  Stress: No Stress Concern Present   Feeling of Stress : Not at all  Social Connections: Socially Integrated   Frequency of Communication with Friends and Family: More than three times a week   Frequency of Social Gatherings with Friends and Family: Twice a week   Attends Religious Services: More than 4 times per year   Active Member of [employer] or Organizations: Yes   Attends Archivist Meetings: 1 to 4 times per year   Marital Status: Married    Activities of Daily Living    12/14/2021    9:14 AM  In your present state of health, do you have any difficulty performing the following activities:  Hearing? 0  Vision? 0  Difficulty concentrating or making decisions? 0  Walking or climbing stairs? 0  Dressing or bathing? 0  Doing errands, shopping? 0  Preparing Food and eating ? N  Using the Toilet? N  In the past six months, have you  accidently leaked urine? N  Do you have problems with loss of bowel control? N  Managing your Medications? N  Managing your Finances? N  Housekeeping or managing your Housekeeping? N    Patient Education/ Literacy How often do you need to have someone help you when you read instructions, pamphlets, or other written materials from your doctor or pharmacy?: 1 - Never What is the last grade level you completed in school?: 12th grade  Exercise Current Exercise Habits: Home exercise routine, Type of exercise: strength training/weights;walking, Time (Minutes): 30, Frequency (Times/Week): 5, Weekly Exercise (Minutes/Week): 150, Intensity: Moderate, Exercise limited by: None identified  Diet Patient reports consuming 2 meals a day and  1 snack(s) a day Patient reports that her primary diet is: Regular Patient reports that she does have regular access to food.   Depression Screen    12/14/2021    9:08 AM 07/20/2021    8:12 AM 12/11/2020    3:19 PM 08/29/2020    9:15 AM 10/04/2017    9:21 AM  PHQ 2/9 Scores  PHQ - 2 Score 0 0 0 2 1  PHQ- 9 Score    4 6     Fall Risk    12/14/2021    9:08 AM 07/20/2021    8:12 AM 12/11/2020    3:16 PM 08/29/2020    8:58 AM  Fall Risk   Falls in the past year? 0 0 0 0  Number falls in past yr: 0  0 0  Injury with Fall? 0  0 0  Risk for fall due to : No Fall Risks  No Fall Risks   Follow up Falls evaluation completed Falls evaluation completed Falls evaluation completed Falls evaluation completed     Objective:  Allaya Abbasi seemed alert and oriented and she participated appropriately during our telephone visit.  Blood Pressure Weight BMI  BP Readings from Last 3 Encounters:  12/14/21 120/70  07/20/21 (!) 168/64  12/12/20 (!) 144/60   Wt Readings from Last 3 Encounters:  12/14/21 128 lb (58.1 kg)  07/20/21 129 lb (58.5 kg)  12/12/20 136 lb (61.7 kg)   BMI Readings from Last 1 Encounters:  12/14/21 23.79 kg/m    *Unable to obtain current vital signs,  weight, and BMI due to telephone visit type  Hearing/Vision  Anoushka did not seem to have difficulty with hearing/understanding during the telephone conversation Reports that she has had a formal eye exam by an eye care professional within the past year Reports that she has not had a formal hearing evaluation within the past year *Unable to fully assess hearing and vision during telephone visit type  Cognitive Function:    12/14/2021    9:16 AM 12/11/2020    3:25 PM  6CIT Screen  What Year? 0 points 0 points  What month? 0 points 0 points  What time? 0 points 0 points  Count back from 20 2 points 0 points  Months in reverse 0 points 0 points  Repeat phrase 0 points 6 points  Total Score 2 points 6 points   (Normal:0-7, Significant for Dysfunction: >8)  Normal Cognitive Function Screening: Yes   Immunization & Health Maintenance Record Immunization History  Administered Date(s) Administered   Moderna Sars-Covid-2 Vaccination 07/31/2020   Tdap 10/04/2017   Zoster Recombinat (Shingrix) 03/14/2018, 05/15/2018    Health Maintenance  Topic Date Due   Pneumonia Vaccine 28+ Years old (1 - PCV) 07/20/2022 (Originally 10/21/2016)   INFLUENZA VACCINE  02/09/2022   MAMMOGRAM  01/01/2023   Fecal DNA (Cologuard)  07/31/2024   TETANUS/TDAP  10/05/2027   DEXA SCAN  Completed   Hepatitis C Screening  Completed   Zoster Vaccines- Shingrix  Completed   HPV VACCINES  Aged Out   COVID-19 Vaccine  Discontinued       Assessment  This is a routine wellness examination for Theresa Herrera.  Health Maintenance: Due or Overdue There are no preventive care reminders to display for this patient.  Feleica Fulmore does not need a referral for Community Assistance: Care Management:   no Social Work:    no Prescription Assistance:  no Nutrition/Diabetes Education:  no   Plan:  Personalized Goals  Goals Addressed               This Visit's Progress     Patient Stated (pt-stated)        She  would like to have more strength training, flexibility and good balance.       Personalized Health Maintenance & Screening Recommendations  Pneumococcal vaccine  Influenza vaccine Mammogram due after 12/31/2021.  Patient declined the vaccines at this time.  Lung Cancer Screening Recommended: no (Low Dose CT Chest recommended if Age 49-80 years, 30 pack-year currently smoking OR have quit w/in past 15 years) Hepatitis C Screening recommended: no HIV Screening recommended: no  Advanced Directives: Written information was not prepared per patient's request.  Referrals & Orders No orders of the defined types were placed in this encounter.   Follow-up Plan Follow-up with Donella Stade, PA-C as planned Discuss Cologuard and [place] results with PCP at next appointment. Medicare wellness visit in one year.  Patient will access AVS on my chart.   I have personally reviewed and noted the following in the patient's chart:   Medical and social history Use of alcohol, tobacco or illicit drugs  Current medications and supplements Functional ability and status Nutritional status Physical activity Advanced directives List of other physicians Hospitalizations, surgeries, and ER visits in previous 12 months Vitals Screenings to include cognitive, depression, and falls Referrals and appointments  In addition, I have reviewed and discussed with [name] certain preventive protocols, quality metrics, and best practice recommendations. A written personalized care plan for preventive services as well as general preventive health recommendations is available and can be mailed to the patient at her request.      Tinnie Gens, RN-BSN  12/14/2021

## 2021-12-14 NOTE — Patient Instructions (Addendum)
[facility] Maintenance Summary and Written Plan of Care  Theresa Herrera ,  Thank you for allowing me to perform your Medicare Annual Wellness Visit and for your ongoing commitment to your health.   Health Maintenance & Immunization History Health Maintenance  Topic Date Due  . Pneumonia Vaccine 46+ Years old (1 - PCV) 07/20/2022 (Originally 10/21/2016)  . INFLUENZA VACCINE  02/09/2022  . MAMMOGRAM  01/01/2023  . Fecal DNA (Cologuard)  07/31/2024  . TETANUS/TDAP  10/05/2027  . DEXA SCAN  Completed  . Hepatitis C Screening  Completed  . Zoster Vaccines- Shingrix  Completed  . HPV VACCINES  Aged Out  . COVID-19 Vaccine  Discontinued   Immunization History  Administered Date(s) Administered  . Moderna Sars-Covid-2 Vaccination 07/31/2020  . Tdap 10/04/2017  . Zoster Recombinat (Shingrix) 03/14/2018, 05/15/2018    These are the patient goals that we discussed:  Goals Addressed              This Visit's Progress   .  Patient Stated (pt-stated)        She would like to have more strength training, flexibility and good balance.        This is a list of Health Maintenance Items that are overdue or due now: Pneumococcal vaccine  Influenza vaccine Mammogram due after 12/31/2021  Patient declined the vaccines at this time.  Orders/Referrals Placed Today: No orders of the defined types were placed in this encounter.  (Contact our referral department at [phone] if you have not spoken with someone about your referral appointment within the next 5 days)    Follow-up Plan Follow-up with Donella Stade, PA-C as planned Discuss Cologuard and [place] results with PCP at next appointment. Medicare wellness visit in one year.  Patient will access AVS on my chart.      Health Maintenance, Female Adopting a healthy lifestyle and getting preventive care are important in promoting health and wellness. Ask your health care provider about: The right  schedule for you to have regular tests and exams. Things you can do on your own to prevent diseases and keep yourself healthy. What should I know about diet, weight, and exercise? Eat a healthy diet  Eat a diet that includes plenty of vegetables, fruits, low-fat dairy products, and lean protein. Do not eat a lot of foods that are high in solid fats, added sugars, or sodium. Maintain a healthy weight Body mass index (BMI) is used to identify weight problems. It estimates body fat based on height and weight. Your health care provider can help determine your BMI and help you achieve or maintain a healthy weight. Get regular exercise Get regular exercise. This is one of the most important things you can do for your health. Most adults should: Exercise for at least 150 minutes each week. The exercise should increase your heart rate and make you sweat (moderate-intensity exercise). Do strengthening exercises at least twice a week. This is in addition to the moderate-intensity exercise. Spend less time sitting. Even light physical activity can be beneficial. Watch cholesterol and blood lipids Have your blood tested for lipids and cholesterol at 70 years of age, then have this test every 5 years. Have your cholesterol levels checked more often if: Your lipid or cholesterol levels are high. You are older than 70 years of age. You are at high risk for heart disease. What should I know about cancer screening? Depending on your health history and family history, you may need  to have cancer screening at various ages. This may include screening for: Breast cancer. Cervical cancer. Colorectal cancer. Skin cancer. Lung cancer. What should I know about heart disease, diabetes, and high blood pressure? Blood pressure and heart disease High blood pressure causes heart disease and increases the risk of stroke. This is more likely to develop in people who have high blood pressure readings or are  overweight. Have your blood pressure checked: Every 3-5 years if you are 71-78 years of age. Every year if you are 54 years old or older. Diabetes Have regular diabetes screenings. This checks your fasting blood sugar level. Have the screening done: Once every three years after age 80 if you are at a normal weight and have a low risk for diabetes. More often and at a younger age if you are overweight or have a high risk for diabetes. What should I know about preventing infection? Hepatitis B If you have a higher risk for hepatitis B, you should be screened for this virus. Talk with your health care provider to find out if you are at risk for hepatitis B infection. Hepatitis C Testing is recommended for: Everyone born from [birth year] through 1965. Anyone with known risk factors for hepatitis C. Sexually transmitted infections (STIs) Get screened for STIs, including gonorrhea and chlamydia, if: You are sexually active and are younger than 70 years of age. You are older than 70 years of age and your health care provider tells you that you are at risk for this type of infection. Your sexual activity has changed since you were last screened, and you are at increased risk for chlamydia or gonorrhea. Ask your health care provider if you are at risk. Ask your health care provider about whether you are at high risk for HIV. Your health care provider may recommend a prescription medicine to help prevent HIV infection. If you choose to take medicine to prevent HIV, you should first get tested for HIV. You should then be tested every 3 months for as long as you are taking the medicine. Pregnancy If you are about to stop having your period (premenopausal) and you may become pregnant, seek counseling before you get pregnant. Take 400 to 800 micrograms (mcg) of folic acid every day if you become pregnant. Ask for birth control (contraception) if you want to prevent pregnancy. Osteoporosis and  menopause Osteoporosis is a disease in which the bones lose minerals and strength with aging. This can result in bone fractures. If you are 73 years old or older, or if you are at risk for osteoporosis and fractures, ask your health care provider if you should: Be screened for bone loss. Take a calcium or vitamin D supplement to lower your risk of fractures. Be given hormone replacement therapy (HRT) to treat symptoms of menopause. Follow these instructions at home: Alcohol use Do not drink alcohol if: Your health care provider tells you not to drink. You are pregnant, may be pregnant, or are planning to become pregnant. If you drink alcohol: Limit how much you have to: 0-1 drink a day. Know how much alcohol is in your drink. In the [place] one drink equals one 12 oz bottle of beer (355 mL), one 5 oz glass of wine (148 mL), or one 1 oz glass of hard liquor (44 mL). Lifestyle Do not use any products that contain nicotine or tobacco. These products include cigarettes, chewing tobacco, and vaping devices, such as e-cigarettes. If you need help quitting, ask your health care provider.  Do not use street drugs. Do not share needles. Ask your health care provider for help if you need support or information about quitting drugs. General instructions Schedule regular health, dental, and eye exams. Stay current with your vaccines. Tell your health care provider if: You often feel depressed. You have ever been abused or do not feel safe at home. Summary Adopting a healthy lifestyle and getting preventive care are important in promoting health and wellness. Follow your health care provider's instructions about healthy diet, exercising, and getting tested or screened for diseases. Follow your health care provider's instructions on monitoring your cholesterol and blood pressure. This information is not intended to replace advice given to you by your health care provider. Make sure you discuss any  questions you have with your health care provider. Document Revised: 11/17/2020 Document Reviewed: 11/17/2020 Elsevier Patient Education  [place].

## 2022-01-18 ENCOUNTER — Encounter: Payer: Self-pay | Admitting: Physician Assistant

## 2022-01-18 ENCOUNTER — Ambulatory Visit (INDEPENDENT_AMBULATORY_CARE_PROVIDER_SITE_OTHER): Payer: Medicare HMO | Admitting: Physician Assistant

## 2022-01-18 VITALS — BP 147/60 | HR 83 | Ht 61.5 in | Wt 131.0 lb

## 2022-01-18 DIAGNOSIS — R002 Palpitations: Secondary | ICD-10-CM

## 2022-01-18 DIAGNOSIS — I10 Essential (primary) hypertension: Secondary | ICD-10-CM | POA: Diagnosis not present

## 2022-01-18 DIAGNOSIS — E782 Mixed hyperlipidemia: Secondary | ICD-10-CM

## 2022-01-18 MED ORDER — LISINOPRIL 5 MG PO TABS: 5.0000 mg | ORAL_TABLET | Freq: Every day | ORAL | 1 refills | Status: DC

## 2022-01-18 MED ORDER — METOPROLOL SUCCINATE ER 25 MG PO TB24: 25.0000 mg | ORAL_TABLET | Freq: Every day | ORAL | 2 refills | Status: DC

## 2022-01-19 LAB — COMPLETE METABOLIC PANEL WITH GFR
AG Ratio: 2.5 (calc) (ref 1.0–2.5)
ALT: 32 U/L — ABNORMAL HIGH (ref 6–29)
AST: 24 U/L (ref 10–35)
Albumin: 4.9 g/dL (ref 3.6–5.1)
Alkaline phosphatase (APISO): 78 U/L (ref 37–153)
BUN: 11 mg/dL (ref 7–25)
CO2: 28 mmol/L (ref 20–32)
Calcium: 10.1 mg/dL (ref 8.6–10.4)
Chloride: 97 mmol/L — ABNORMAL LOW (ref 98–110)
Creat: 0.68 mg/dL (ref 0.60–1.00)
Globulin: 2 g/dL (calc) (ref 1.9–3.7)
Glucose, Bld: 91 mg/dL (ref 65–99)
Potassium: 4 mmol/L (ref 3.5–5.3)
Sodium: 134 mmol/L — ABNORMAL LOW (ref 135–146)
Total Bilirubin: 0.6 mg/dL (ref 0.2–1.2)
Total Protein: 6.9 g/dL (ref 6.1–8.1)
eGFR: 94 mL/min/{1.73_m2} (ref >=60)

## 2022-01-19 LAB — CBC WITH DIFFERENTIAL/PLATELET
Absolute Monocytes: 591 cells/uL (ref 200–950)
Basophils Absolute: 49 cells/uL (ref 0–200)
Basophils Relative: 0.6 %
Eosinophils Absolute: 32 cells/uL (ref 15–500)
Eosinophils Relative: 0.4 %
HCT: 44.4 % (ref 35.0–45.0)
Hemoglobin: 14.9 g/dL (ref 11.7–15.5)
Lymphs Abs: 1588 cells/uL (ref 850–3900)
MCH: 31 pg (ref 27.0–33.0)
MCHC: 33.6 g/dL (ref 32.0–36.0)
MCV: 92.3 fL (ref 80.0–100.0)
MPV: 12.6 fL — ABNORMAL HIGH (ref 7.5–12.5)
Monocytes Relative: 7.3 %
Neutro Abs: 5840 cells/uL (ref 1500–7800)
Neutrophils Relative %: 72.1 %
Platelets: 224 10*3/uL (ref 140–400)
RBC: 4.81 10*6/uL (ref 3.80–5.10)
RDW: 12.4 % (ref 11.0–15.0)
Total Lymphocyte: 19.6 %
WBC: 8.1 10*3/uL (ref 3.8–10.8)

## 2022-01-19 LAB — LIPID PANEL W/REFLEX DIRECT LDL
Cholesterol: 256 mg/dL — ABNORMAL HIGH (ref <200)
HDL: 65 mg/dL (ref >=50)
LDL Cholesterol (Calc): 170 mg/dL (calc) — ABNORMAL HIGH
Non-HDL Cholesterol (Calc): 191 mg/dL (calc) — ABNORMAL HIGH (ref <130)
Total CHOL/HDL Ratio: 3.9 (calc) (ref <5.0)
Triglycerides: 92 mg/dL (ref <150)

## 2022-01-19 NOTE — Progress Notes (Unsigned)
Established Patient Office Visit  Subjective   Patient ID: Theresa Herrera, female    DOB: [birth year]  Age: 71 y.o. MRN: [id number]  Chief Complaint  Patient presents with   Follow-up    HPI Pt is a 71 yo female with HTN, palpitations, and HLD who presents to the clinic for 6 month follow up.   She admits she is not taking all her medication every day but whenever she thinks she needs it. Some days she checks her BP and low in morning so she does not take lisinopril and metoprolol. No CP, palpitations, headaches, SOB. She feels really good. She is active.   .. 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   Elevated vitamin B12 level 09/26/2015   Hyperlipidemia 09/26/2015   Melanoma of skin ([place]) 10/04/2017   Family history of MTHFR deficiency 12/12/2017   History of melanoma 12/12/2017   MTHFR mutation 12/30/2017   Uncontrolled hypertension 09/02/2020   SOB (shortness of breath) on exertion 09/03/2020   Hyponatremia 09/03/2020   Left carotid bruit 09/03/2020   Type O blood, Rh negative 09/05/2020   Hypokalemia 12/15/2020   Osteoporosis 12/31/2020   White coat syndrome with hypertension 07/20/2021   Vitamin D deficiency 07/20/2021   Resolved Ambulatory Problems    Diagnosis Date Noted   No Resolved Ambulatory Problems   Past Medical History:  Diagnosis Date   Hypertension      Review of Systems  All other systems reviewed and are negative.     Objective:     BP (!) 147/60   Pulse 83   Ht 5' 1.5" (1.562 m)   Wt 131 lb (59.4 kg)   SpO2 99%   BMI 24.35 kg/m  BP Readings from Last 3 Encounters:  01/18/22 (!) 147/60  12/14/21 120/70  07/20/21 (!) 168/64   Wt Readings from Last 3 Encounters:  01/18/22 131 lb (59.4 kg)  12/14/21 128 lb (58.1 kg)  07/20/21 129 lb (58.5 kg)      Physical Exam Constitutional:       Appearance: Normal appearance.  HENT:     Head: Normocephalic and atraumatic.  Cardiovascular:     Rate and Rhythm: Normal rate and regular rhythm.     Pulses: Normal pulses.     Heart sounds: Normal heart sounds.  Musculoskeletal:     Cervical back: Normal range of motion and neck supple.  Lymphadenopathy:     Cervical: No cervical adenopathy.  Neurological:     Mental Status: She is alert.          Assessment & Plan:  Marland KitchenMarland KitchenMacall was seen today for follow-up.  Diagnoses and all orders for this visit:  Essential hypertension, benign -     COMPLETE METABOLIC PANEL WITH GFR -     metoprolol succinate (TOPROL-XL) 25 MG 24 hr tablet; Take 1 tablet (25 mg total) by mouth daily. -     lisinopril (ZESTRIL) 5 MG tablet; Take 1 tablet (5 mg total) by mouth daily. -     CBC w/Diff/Platelet  Mixed hyperlipidemia -     Lipid Panel w/reflex Direct LDL  Palpitations -     metoprolol succinate (TOPROL-XL) 25 MG 24 hr tablet; Take 1 tablet (25 mg total) by mouth daily.   BP is elevated in office( 2nd recheck did improve) but per patient at home in the 120's  over 80s.  Pt is not taking medications daily, discussed importance of this Lowered doses of both lisinopril and metoprolol Fasting labs ordered Follow up in 6 months.    Return in about 6 months (around 07/21/2022).    Iran Planas, PA-C

## 2022-01-19 NOTE — Progress Notes (Signed)
Emalina,   Glucose and kidney look great.  Calcium in normal range.  Sodium just a hair low. Increase salt just a tad.  ALT just a little elevated. A little bit of weight loss could help that.   Hemoglobin looks good.   Marland Kitchen.The 10-year ASCVD risk score (Arnett DK, et al., 2019) is: 17%   Values used to calculate the score:     Age: 71 years     Sex: Female     Is Non-Hispanic African American: No     Diabetic: No     Tobacco smoker: No     Systolic Blood Pressure: 850 mmHg     Is BP treated: Yes     HDL Cholesterol: 65 mg/dL     Total Cholesterol: 256 mg/dL  Your 10 year cardiovascular risk is high at 17 percent. You need to take lipitor DAILY>

## 2022-02-02 ENCOUNTER — Other Ambulatory Visit: Payer: Self-pay | Admitting: Physician Assistant

## 2022-02-02 DIAGNOSIS — I10 Essential (primary) hypertension: Secondary | ICD-10-CM

## 2022-03-25 DIAGNOSIS — B338 Other specified viral diseases: Secondary | ICD-10-CM | POA: Diagnosis not present

## 2022-04-11 ENCOUNTER — Other Ambulatory Visit: Payer: Self-pay | Admitting: Physician Assistant

## 2022-04-11 DIAGNOSIS — R002 Palpitations: Secondary | ICD-10-CM

## 2022-04-11 DIAGNOSIS — I10 Essential (primary) hypertension: Secondary | ICD-10-CM

## 2022-06-23 ENCOUNTER — Other Ambulatory Visit: Payer: Self-pay | Admitting: Physician Assistant

## 2022-06-23 DIAGNOSIS — R002 Palpitations: Secondary | ICD-10-CM

## 2022-06-23 DIAGNOSIS — I10 Essential (primary) hypertension: Secondary | ICD-10-CM

## 2022-07-21 ENCOUNTER — Ambulatory Visit: Payer: Medicare HMO | Admitting: Physician Assistant

## 2022-07-21 DIAGNOSIS — D3131 Benign neoplasm of right choroid: Secondary | ICD-10-CM | POA: Diagnosis not present

## 2022-07-21 DIAGNOSIS — H2513 Age-related nuclear cataract, bilateral: Secondary | ICD-10-CM | POA: Diagnosis not present

## 2022-07-21 DIAGNOSIS — H43812 Vitreous degeneration, left eye: Secondary | ICD-10-CM | POA: Diagnosis not present

## 2022-08-05 ENCOUNTER — Other Ambulatory Visit: Payer: Self-pay | Admitting: Physician Assistant

## 2022-08-05 DIAGNOSIS — I10 Essential (primary) hypertension: Secondary | ICD-10-CM

## 2022-09-06 ENCOUNTER — Ambulatory Visit (INDEPENDENT_AMBULATORY_CARE_PROVIDER_SITE_OTHER): Payer: Medicare HMO | Admitting: Physician Assistant

## 2022-09-06 ENCOUNTER — Encounter: Payer: Self-pay | Admitting: Physician Assistant

## 2022-09-06 VITALS — BP 160/72 | HR 93 | Ht 61.5 in | Wt 132.0 lb

## 2022-09-06 DIAGNOSIS — I10 Essential (primary) hypertension: Secondary | ICD-10-CM

## 2022-09-06 DIAGNOSIS — R002 Palpitations: Secondary | ICD-10-CM | POA: Diagnosis not present

## 2022-09-06 DIAGNOSIS — E782 Mixed hyperlipidemia: Secondary | ICD-10-CM

## 2022-09-06 MED ORDER — METOPROLOL SUCCINATE ER 25 MG PO TB24: 25.0000 mg | ORAL_TABLET | Freq: Every day | ORAL | 1 refills | Status: DC

## 2022-09-06 NOTE — Progress Notes (Signed)
Established Patient Office Visit  Subjective   Patient ID: Theresa Herrera, female    DOB: 10-Apr-1951  Age: 72 y.o. MRN: [id number]  Chief Complaint  Patient presents with   Follow-up   Hypertension    HPI Pt is a 72 yo female who presents to the clinic for 6 month follow up. She is doing great today. She denies any CP, palpitations, headaches or vision changes. She states "she feels great".  She checks BP at home and runs 110's over 60s. She is very active. No concerns.   .. 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   Elevated vitamin B12 level 09/26/2015   Hyperlipidemia 09/26/2015   Melanoma of skin ([place]) 10/04/2017   Family history of MTHFR deficiency 12/12/2017   History of melanoma 12/12/2017   MTHFR mutation 12/30/2017   Uncontrolled hypertension 09/02/2020   SOB (shortness of breath) on exertion 09/03/2020   Hyponatremia 09/03/2020   Left carotid bruit 09/03/2020   Type O blood, Rh negative 09/05/2020   Hypokalemia 12/15/2020   Osteoporosis 12/31/2020   White coat syndrome with hypertension 07/20/2021   Vitamin D deficiency 07/20/2021   Resolved Ambulatory Problems    Diagnosis Date Noted   No Resolved Ambulatory Problems   Past Medical History:  Diagnosis Date   Hypertension      Review of Systems  All other systems reviewed and are negative.     Objective:     BP (!) 168/70   Pulse 93   Ht 5' 1.5" (1.562 m)   Wt 132 lb (59.9 kg)   SpO2 100%   BMI 24.54 kg/m  BP Readings from Last 3 Encounters:  09/06/22 (!) 168/70  01/18/22 (!) 147/60  12/14/21 120/70   Wt Readings from Last 3 Encounters:  09/06/22 132 lb (59.9 kg)  01/18/22 131 lb (59.4 kg)  12/14/21 128 lb (58.1 kg)      Physical Exam Vitals reviewed.  Constitutional:      Appearance: Normal appearance.  HENT:     Head: Normocephalic.   Neck:     Vascular: No carotid bruit.  Cardiovascular:     Rate and Rhythm: Normal rate and regular rhythm.     Pulses: Normal pulses.     Heart sounds: Normal heart sounds.  Pulmonary:     Effort: Pulmonary effort is normal.     Breath sounds: Normal breath sounds.  Musculoskeletal:     Cervical back: No tenderness.     Right lower leg: No edema.     Left lower leg: No edema.  Lymphadenopathy:     Cervical: No cervical adenopathy.  Neurological:     General: No focal deficit present.     Mental Status: She is alert and oriented to person, place, and time.  Psychiatric:        Mood and Affect: Mood normal.      The 10-year ASCVD risk score (Arnett DK, et al., 2019) is: 21.6%    Assessment & Plan:  [name] was seen today for follow-up and hypertension.  Diagnoses and all orders for this visit:  Essential hypertension, benign -     COMPLETE METABOLIC PANEL WITH GFR -     metoprolol succinate (TOPROL-XL) 25 MG 24 hr tablet; Take 1 tablet (25 mg total) by mouth daily.  Mixed hyperlipidemia -     Lipid Panel w/reflex  Direct LDL  Palpitations -     metoprolol succinate (TOPROL-XL) 25 MG 24 hr tablet; Take 1 tablet (25 mg total) by mouth daily.   BP elevated today-she did not take medications today because fasting for labs Home readings are good Refilled medication Fasting labs ordered Follow up in 6 months Pt declined all vaccines    Iran Planas, PA-C

## 2022-09-07 ENCOUNTER — Other Ambulatory Visit: Payer: Self-pay | Admitting: Physician Assistant

## 2022-09-07 ENCOUNTER — Encounter: Payer: Self-pay | Admitting: Physician Assistant

## 2022-09-07 DIAGNOSIS — I10 Essential (primary) hypertension: Secondary | ICD-10-CM

## 2022-09-07 LAB — COMPLETE METABOLIC PANEL WITH GFR
AG Ratio: 2 (calc) (ref 1.0–2.5)
ALT: 19 U/L (ref 6–29)
AST: 17 U/L (ref 10–35)
Albumin: 4.6 g/dL (ref 3.6–5.1)
Alkaline phosphatase (APISO): 90 U/L (ref 37–153)
BUN: 12 mg/dL (ref 7–25)
CO2: 25 mmol/L (ref 20–32)
Calcium: 9.8 mg/dL (ref 8.6–10.4)
Chloride: 100 mmol/L (ref 98–110)
Creat: 0.65 mg/dL (ref 0.60–1.00)
Globulin: 2.3 g/dL (calc) (ref 1.9–3.7)
Glucose, Bld: 92 mg/dL (ref 65–99)
Potassium: 4.2 mmol/L (ref 3.5–5.3)
Sodium: 137 mmol/L (ref 135–146)
Total Bilirubin: 0.7 mg/dL (ref 0.2–1.2)
Total Protein: 6.9 g/dL (ref 6.1–8.1)
eGFR: 95 mL/min/{1.73_m2} (ref >=60)

## 2022-09-07 LAB — LIPID PANEL W/REFLEX DIRECT LDL
Cholesterol: 266 mg/dL — ABNORMAL HIGH (ref <200)
HDL: 64 mg/dL (ref >=50)
LDL Cholesterol (Calc): 182 mg/dL (calc) — ABNORMAL HIGH
Non-HDL Cholesterol (Calc): 202 mg/dL (calc) — ABNORMAL HIGH (ref <130)
Total CHOL/HDL Ratio: 4.2 (calc) (ref <5.0)
Triglycerides: 90 mg/dL (ref <150)

## 2022-09-07 MED ORDER — LISINOPRIL 5 MG PO TABS: 5.0000 mg | ORAL_TABLET | Freq: Every day | ORAL | 1 refills | Status: DC

## 2022-09-07 MED ORDER — ATORVASTATIN CALCIUM 40 MG PO TABS: 40.0000 mg | ORAL_TABLET | Freq: Every day | ORAL | 3 refills | Status: DC

## 2022-09-07 NOTE — Progress Notes (Signed)
Theresa Herrera,   Your kidney, liver, glucose look GREAT.  Your LDL has increased.   Your 10 year risk is 20.1 percent of CV event. You need to take your lipitor daily!  Marland KitchenMarland KitchenThe 10-year ASCVD risk score (Arnett DK, et al., 2019) is: 20.1%   Values used to calculate the score:     Age: 72 years     Sex: Female     Is Non-Hispanic African American: No     Diabetic: No     Tobacco smoker: No     Systolic Blood Pressure: 0000000 mmHg     Is BP treated: Yes     HDL Cholesterol: 64 mg/dL     Total Cholesterol: 266 mg/dL

## 2022-09-20 DIAGNOSIS — L578 Other skin changes due to chronic exposure to nonionizing radiation: Secondary | ICD-10-CM | POA: Diagnosis not present

## 2022-09-20 DIAGNOSIS — L668 Other cicatricial alopecia: Secondary | ICD-10-CM | POA: Diagnosis not present

## 2022-09-20 DIAGNOSIS — L448 Other specified papulosquamous disorders: Secondary | ICD-10-CM | POA: Diagnosis not present

## 2022-09-20 DIAGNOSIS — Z08 Encounter for follow-up examination after completed treatment for malignant neoplasm: Secondary | ICD-10-CM | POA: Diagnosis not present

## 2022-09-20 DIAGNOSIS — R208 Other disturbances of skin sensation: Secondary | ICD-10-CM | POA: Diagnosis not present

## 2022-09-20 DIAGNOSIS — L298 Other pruritus: Secondary | ICD-10-CM | POA: Diagnosis not present

## 2022-09-20 DIAGNOSIS — Z85828 Personal history of other malignant neoplasm of skin: Secondary | ICD-10-CM | POA: Diagnosis not present

## 2022-09-20 DIAGNOSIS — Z85820 Personal history of malignant melanoma of skin: Secondary | ICD-10-CM | POA: Diagnosis not present

## 2022-09-20 DIAGNOSIS — L821 Other seborrheic keratosis: Secondary | ICD-10-CM | POA: Diagnosis not present

## 2022-09-20 DIAGNOSIS — L814 Other melanin hyperpigmentation: Secondary | ICD-10-CM | POA: Diagnosis not present

## 2022-09-20 DIAGNOSIS — D225 Melanocytic nevi of trunk: Secondary | ICD-10-CM | POA: Diagnosis not present

## 2022-09-20 DIAGNOSIS — L538 Other specified erythematous conditions: Secondary | ICD-10-CM | POA: Diagnosis not present

## 2022-12-20 ENCOUNTER — Telehealth: Payer: Self-pay | Admitting: Physician Assistant

## 2022-12-20 ENCOUNTER — Ambulatory Visit (INDEPENDENT_AMBULATORY_CARE_PROVIDER_SITE_OTHER): Payer: Medicare HMO | Admitting: Physician Assistant

## 2022-12-20 DIAGNOSIS — Z0000 Encounter for general adult medical examination without abnormal findings: Secondary | ICD-10-CM

## 2022-12-20 NOTE — Telephone Encounter (Signed)
Pt dropped her Power of Attorney paperwork for her provider for herself and spouse Amirah Goerke and I placed In [employer]

## 2022-12-20 NOTE — Patient Instructions (Addendum)
MEDICARE ANNUAL WELLNESS VISIT Health Maintenance Summary and Written Plan of Care  Ms. Theresa Herrera ,  Thank you for allowing me to perform your Medicare Annual Wellness Visit and for your ongoing commitment to your health.   Health Maintenance & Immunization History Health Maintenance  Topic Date Due   Pneumonia Vaccine 31+ Years old (1 of 1 - PCV) 09/07/2023 (Originally 10/21/2016)   MAMMOGRAM  01/01/2023   INFLUENZA VACCINE  02/10/2023   Medicare Annual Wellness (AWV)  12/20/2023   Fecal DNA (Cologuard)  07/31/2024   DTaP/Tdap/Td (2 - Td or Tdap) 10/05/2027   DEXA SCAN  Completed   Hepatitis C Screening  Completed   Zoster Vaccines- Shingrix  Completed   HPV VACCINES  Aged Out   COVID-19 Vaccine  Discontinued   Immunization History  Administered Date(s) Administered   Moderna Sars-Covid-2 Vaccination 07/31/2020   Tdap 10/04/2017   Zoster Recombinat (Shingrix) 03/14/2018, 05/15/2018    These are the patient goals that we discussed:  Goals Addressed               This Visit's Progress     Patient Stated (pt-stated)        Patient stated that she would like to make her health a top priority.          This is a list of Health Maintenance Items that are overdue or due now: There are no preventive care reminders to display for this patient.    Orders/Referrals Placed Today: No orders of the defined types were placed in this encounter.  (Contact our referral department at [phone] if you have not spoken with someone about your referral appointment within the next 5 days)    Follow-up Plan Follow-up with Jomarie Longs, PA-C as planned Medicare wellness visit in one year.  Patient will access AVS on my chart.      Health Maintenance, Female Adopting a healthy lifestyle and getting preventive care are important in promoting health and wellness. Ask your health care provider about: The right schedule for you to have regular tests and exams. Things you can do  on your own to prevent diseases and keep yourself healthy. What should I know about diet, weight, and exercise? Eat a healthy diet  Eat a diet that includes plenty of vegetables, fruits, low-fat dairy products, and lean protein. Do not eat a lot of foods that are high in solid fats, added sugars, or sodium. Maintain a healthy weight Body mass index (BMI) is used to identify weight problems. It estimates body fat based on height and weight. Your health care provider can help determine your BMI and help you achieve or maintain a healthy weight. Get regular exercise Get regular exercise. This is one of the most important things you can do for your health. Most adults should: Exercise for at least 150 minutes each week. The exercise should increase your heart rate and make you sweat (moderate-intensity exercise). Do strengthening exercises at least twice a week. This is in addition to the moderate-intensity exercise. Spend less time sitting. Even light physical activity can be beneficial. Watch cholesterol and blood lipids Have your blood tested for lipids and cholesterol at 71 years of age, then have this test every 5 years. Have your cholesterol levels checked more often if: Your lipid or cholesterol levels are high. You are older than 71 years of age. You are at high risk for heart disease. What should I know about cancer screening? Depending on your health history and family  history, you may need to have cancer screening at various ages. This may include screening for: Breast cancer. Cervical cancer. Colorectal cancer. Skin cancer. Lung cancer. What should I know about heart disease, diabetes, and high blood pressure? Blood pressure and heart disease High blood pressure causes heart disease and increases the risk of stroke. This is more likely to develop in people who have high blood pressure readings or are overweight. Have your blood pressure checked: Every 3-5 years if you are 89-24  years of age. Every year if you are 76 years old or older. Diabetes Have regular diabetes screenings. This checks your fasting blood sugar level. Have the screening done: Once every three years after age 53 if you are at a normal weight and have a low risk for diabetes. More often and at a younger age if you are overweight or have a high risk for diabetes. What should I know about preventing infection? Hepatitis B If you have a higher risk for hepatitis B, you should be screened for this virus. Talk with your health care provider to find out if you are at risk for hepatitis B infection. Hepatitis C Testing is recommended for: Everyone born from [birth year] through 1965. Anyone with known risk factors for hepatitis C. Sexually transmitted infections (STIs) Get screened for STIs, including gonorrhea and chlamydia, if: You are sexually active and are younger than 71 years of age. You are older than 71 years of age and your health care provider tells you that you are at risk for this type of infection. Your sexual activity has changed since you were last screened, and you are at increased risk for chlamydia or gonorrhea. Ask your health care provider if you are at risk. Ask your health care provider about whether you are at high risk for HIV. Your health care provider may recommend a prescription medicine to help prevent HIV infection. If you choose to take medicine to prevent HIV, you should first get tested for HIV. You should then be tested every 3 months for as long as you are taking the medicine. Pregnancy If you are about to stop having your period (premenopausal) and you may become pregnant, seek counseling before you get pregnant. Take 400 to 800 micrograms (mcg) of folic acid every day if you become pregnant. Ask for birth control (contraception) if you want to prevent pregnancy. Osteoporosis and menopause Osteoporosis is a disease in which the bones lose minerals and strength with aging. This  can result in bone fractures. If you are 79 years old or older, or if you are at risk for osteoporosis and fractures, ask your health care provider if you should: Be screened for bone loss. Take a calcium or vitamin D supplement to lower your risk of fractures. Be given hormone replacement therapy (HRT) to treat symptoms of menopause. Follow these instructions at home: Alcohol use Do not drink alcohol if: Your health care provider tells you not to drink. You are pregnant, may be pregnant, or are planning to become pregnant. If you drink alcohol: Limit how much you have to: 0-1 drink a day. Know how much alcohol is in your drink. In the [place] one drink equals one 12 oz bottle of beer (355 mL), one 5 oz glass of wine (148 mL), or one 1 oz glass of hard liquor (44 mL). Lifestyle Do not use any products that contain nicotine or tobacco. These products include cigarettes, chewing tobacco, and vaping devices, such as e-cigarettes. If you need help quitting, ask  your health care provider. Do not use street drugs. Do not share needles. Ask your health care provider for help if you need support or information about quitting drugs. General instructions Schedule regular health, dental, and eye exams. Stay current with your vaccines. Tell your health care provider if: You often feel depressed. You have ever been abused or do not feel safe at home. Summary Adopting a healthy lifestyle and getting preventive care are important in promoting health and wellness. Follow your health care provider's instructions about healthy diet, exercising, and getting tested or screened for diseases. Follow your health care provider's instructions on monitoring your cholesterol and blood pressure. This information is not intended to replace advice given to you by your health care provider. Make sure you discuss any questions you have with your health care provider. Document Revised: 11/17/2020 Document Reviewed:  11/17/2020 Elsevier Patient Education  2024 [employer].

## 2022-12-20 NOTE — Progress Notes (Signed)
MEDICARE ANNUAL WELLNESS VISIT  12/20/2022  Telephone Visit Disclaimer This Medicare AWV was conducted by telephone due to national recommendations for restrictions regarding the COVID-19 Pandemic (e.g. social distancing).  I verified, using two identifiers, that I am speaking with Theresa Herrera or their authorized healthcare agent. I discussed the limitations, risks, security, and privacy concerns of performing an evaluation and management service by telephone and the potential availability of an in-person appointment in the future. The patient expressed understanding and agreed to proceed.  Location of Patient: Home Location of Provider (nurse):  Provider home  Subjective:    Theresa Herrera is a 72 y.o. female patient of Caleen Essex, Lonna Cobb, PA-C who had a [employer] Visit today via telephone. Theresa Herrera is Retired and lives with their spouse. she has 1 child. she reports that she is socially active and does interact with friends/family regularly. she is moderately physically active and enjoys yard work, music, playing the piano and decorating.  Patient Care Team: Nolene Ebbs as PCP - General (Family Medicine)     12/20/2022    9:08 AM 12/14/2021    9:08 AM 12/11/2020    3:13 PM 09/24/2015    9:31 AM  Advanced Directives  Does Patient Have a Medical Advance Directive? Yes Yes Yes No  Type of Advance Directive Living will Living will Healthcare Power of [place];Living will   Does patient want to make changes to medical advance directive? No - Patient declined No - Patient declined No - Patient declined   Copy of Healthcare Power of Attorney in Chart?   No - copy requested   Would patient like information on creating a medical advance directive?    No - patient declined information    Hospital Utilization Over the Past 12 Months: # of hospitalizations or ER visits: 0 # of surgeries: 0  Review of Systems    Patient reports that her overall health is unchanged compared to  last year.  History obtained from chart review and the patient  Patient Reported Readings (BP, Pulse, CBG, Weight, etc) none  Pain Assessment Pain : No/denies pain     Current Medications & Allergies (verified) Allergies as of 12/20/2022       Reactions   Doxycycline Swelling   Moderna Covid-19 Vaccine [covid-19 Mrna Vacc (moderna)]    Dehydration; ?heart attack        Medication List        Accurate as of December 20, 2022  9:19 AM. If you have any questions, ask your nurse or doctor.          AMBULATORY NON FORMULARY MEDICATION Vitamin D green drink  Liposomal liquid vitamins   aspirin 81 MG tablet Take 81 mg by mouth daily. Doesn't take it everyday   atorvastatin 40 MG tablet Commonly known as: LIPITOR Take 1 tablet (40 mg total) by mouth daily.   lisinopril 5 MG tablet Commonly known as: ZESTRIL Take 1 tablet (5 mg total) by mouth daily.   metoprolol succinate 25 MG 24 hr tablet Commonly known as: TOPROL-XL Take 1 tablet (25 mg total) by mouth daily.   UNABLE TO FIND Med Name: Mana - liposomal bone health liquid drink        History (reviewed): Past Medical History:  Diagnosis Date   Hyperlipidemia    Hypertension    Past Surgical History:  Procedure Laterality Date   TONSILLECTOMY  1958   TUBAL LIGATION     Family History  Problem Relation  Age of Onset   Hypertension Father    Hyperlipidemia Father    Heart attack Father    Cancer Mother    Hyperlipidemia Mother    Hypertension Mother    Social History   Socioeconomic History   Marital status: Married    Spouse name: Onalee Hua   Number of children: 1   Years of education: 12   Highest education level: 12th grade  Occupational History    Comment: Retired  Tobacco Use   Smoking status: Never   Smokeless tobacco: Never  Vaping Use   Vaping Use: Never used  Substance and Sexual Activity   Alcohol use: No   Drug use: Not Currently   Sexual activity: Never  Other Topics Concern    Not on file  Social History Narrative   Lives with her husband. She has one son who lives with [place]win [place]. She enjoys yardwork, music, playing piano and decorating.   Social Determinants of Health   Financial Resource Strain: Low Risk  (12/20/2022)   Overall Financial Resource Strain (CARDIA)    Difficulty of Paying Living Expenses: Not hard at all  Food Insecurity: No Food Insecurity (12/20/2022)   Hunger Vital Sign    Worried About Running Out of Food in the Last Year: Never true    Ran Out of Food in the Last Year: Never true  Transportation Needs: No Transportation Needs (12/20/2022)   PRAPARE - Administrator, Civil Service (Medical): No    Lack of Transportation (Non-Medical): No  Physical Activity: Insufficiently Active (12/20/2022)   Exercise Vital Sign    Days of Exercise per Week: 4 days    Minutes of Exercise per Session: 30 min  Stress: No Stress Concern Present (12/20/2022)   [employer] of Occupational Health - Occupational Stress Questionnaire    Feeling of Stress : Not at all  Social Connections: Moderately Integrated (12/20/2022)   Social Connection and Isolation Panel [NHANES]    Frequency of Communication with Friends and Family: Twice a week    Frequency of Social Gatherings with Friends and Family: Three times a week    Attends Religious Services: More than 4 times per year    Active Member of Clubs or Organizations: No    Attends Banker Meetings: Never    Marital Status: Married    Activities of Daily Living    12/20/2022    9:13 AM  In your present state of health, do you have any difficulty performing the following activities:  Hearing? 0  Vision? 0  Difficulty concentrating or making decisions? 0  Walking or climbing stairs? 0  Dressing or bathing? 0  Doing errands, shopping? 0  Preparing Food and eating ? N  Using the Toilet? N  In the past six months, have you accidently leaked urine? N  Do you have problems with  loss of bowel control? N  Managing your Medications? N  Managing your Finances? N  Housekeeping or managing your Housekeeping? N    Patient Education/ Literacy How often do you need to have someone help you when you read instructions, pamphlets, or other written materials from your doctor or pharmacy?: 1 - Never What is the last grade level you completed in school?: 12th grade  Exercise Current Exercise Habits: Home exercise routine, Type of exercise: walking, Time (Minutes): 30, Frequency (Times/Week): 4, Weekly Exercise (Minutes/Week): 120, Intensity: Moderate, Exercise limited by: None identified  Diet Patient reports consuming 2 meals a day and 1 snack(s)  a day Patient reports that her primary diet is: Regular Patient reports that she does have regular access to food.   Depression Screen    12/20/2022    9:08 AM 09/06/2022    9:18 AM 01/18/2022    8:02 AM 12/14/2021    9:08 AM 07/20/2021    8:12 AM 12/11/2020    3:19 PM 08/29/2020    9:15 AM  PHQ 2/9 Scores  PHQ - 2 Score 0 0 0 0 0 0 2  PHQ- 9 Score       4     Fall Risk    12/20/2022    9:08 AM 09/06/2022    9:18 AM 01/18/2022    8:02 AM 12/14/2021    9:08 AM 07/20/2021    8:12 AM  Fall Risk   Falls in the past year? 0 0 0 0 0  Number falls in past yr: 0 0 0 0   Injury with Fall? 0 0 0 0   Risk for fall due to : No Fall Risks No Fall Risks No Fall Risks No Fall Risks   Follow up Falls evaluation completed;Education provided Falls evaluation completed Falls evaluation completed Falls evaluation completed Falls evaluation completed     Objective:  Theresa Herrera seemed alert and oriented and she participated appropriately during our telephone visit.  Blood Pressure Weight BMI  BP Readings from Last 3 Encounters:  09/06/22 (!) 160/72  01/18/22 (!) 147/60  12/14/21 120/70   Wt Readings from Last 3 Encounters:  09/06/22 132 lb (59.9 kg)  01/18/22 131 lb (59.4 kg)  12/14/21 128 lb (58.1 kg)   BMI Readings from Last 1  Encounters:  09/06/22 24.54 kg/m    *Unable to obtain current vital signs, weight, and BMI due to telephone visit type  Hearing/Vision  Theresa Herrera did not seem to have difficulty with hearing/understanding during the telephone conversation Reports that she has had a formal eye exam by an eye care professional within the past year Reports that she has not had a formal hearing evaluation within the past year *Unable to fully assess hearing and vision during telephone visit type  Cognitive Function:    12/20/2022    9:14 AM 12/14/2021    9:16 AM 12/11/2020    3:25 PM  6CIT Screen  What Year? 0 points 0 points 0 points  What month? 0 points 0 points 0 points  What time? 0 points 0 points 0 points  Count back from 20 0 points 2 points 0 points  Months in reverse 0 points 0 points 0 points  Repeat phrase 0 points 0 points 6 points  Total Score 0 points 2 points 6 points   (Normal:0-7, Significant for Dysfunction: >8)  Normal Cognitive Function Screening: Yes   Immunization & Health Maintenance Record Immunization History  Administered Date(s) Administered   Moderna Sars-Covid-2 Vaccination 07/31/2020   Tdap 10/04/2017   Zoster Recombinat (Shingrix) 03/14/2018, 05/15/2018    Health Maintenance  Topic Date Due   Pneumonia Vaccine 83+ Years old (1 of 1 - PCV) 09/07/2023 (Originally 10/21/2016)   MAMMOGRAM  01/01/2023   INFLUENZA VACCINE  02/10/2023   Medicare Annual Wellness (AWV)  12/20/2023   Fecal DNA (Cologuard)  07/31/2024   DTaP/Tdap/Td (2 - Td or Tdap) 10/05/2027   DEXA SCAN  Completed   Hepatitis C Screening  Completed   Zoster Vaccines- Shingrix  Completed   HPV VACCINES  Aged Out   COVID-19 Vaccine  Discontinued  Assessment  This is a routine wellness examination for Theresa Herrera.  Health Maintenance: Due or Overdue There are no preventive care reminders to display for this patient.   Theresa Herrera does not need a referral for Community Assistance: Care  Management:   no Social Work:    no Prescription Assistance:  no Nutrition/Diabetes Education:  no   Plan:  Personalized Goals  Goals Addressed               This Visit's Progress     Patient Stated (pt-stated)        Patient stated that she would like to make her health a top priority.        Personalized Health Maintenance & Screening Recommendations  Pneumococcal vaccine  - declined at this time.  Lung Cancer Screening Recommended: no (Low Dose CT Chest recommended if Age 39-80 years, 20 pack-year currently smoking OR have quit w/in past 15 years) Hepatitis C Screening recommended: no HIV Screening recommended: no  Advanced Directives: Written information was not prepared per patient's request.  Referrals & Orders No orders of the defined types were placed in this encounter.   Follow-up Plan Follow-up with Jomarie Longs, PA-C as planned Medicare wellness visit in one year.  Patient will access AVS on my chart.   I have personally reviewed and noted the following in the patient's chart:   Medical and social history Use of alcohol, tobacco or illicit drugs  Current medications and supplements Functional ability and status Nutritional status Physical activity Advanced directives List of other physicians Hospitalizations, surgeries, and ER visits in previous 12 months Vitals Screenings to include cognitive, depression, and falls Referrals and appointments  In addition, I have reviewed and discussed with Theresa Herrera certain preventive protocols, quality metrics, and best practice recommendations. A written personalized care plan for preventive services as well as general preventive health recommendations is available and can be mailed to the patient at her request.      Modesto Charon, RN BSN  12/20/2022

## 2023-02-28 ENCOUNTER — Other Ambulatory Visit: Payer: Self-pay | Admitting: Physician Assistant

## 2023-02-28 DIAGNOSIS — R002 Palpitations: Secondary | ICD-10-CM

## 2023-02-28 DIAGNOSIS — I10 Essential (primary) hypertension: Secondary | ICD-10-CM

## 2023-03-08 ENCOUNTER — Ambulatory Visit: Payer: Medicare HMO | Admitting: Physician Assistant

## 2023-04-15 ENCOUNTER — Other Ambulatory Visit: Payer: Self-pay | Admitting: Physician Assistant

## 2023-04-15 DIAGNOSIS — I10 Essential (primary) hypertension: Secondary | ICD-10-CM

## 2023-06-13 ENCOUNTER — Encounter: Payer: Self-pay | Admitting: Physician Assistant

## 2023-06-13 ENCOUNTER — Ambulatory Visit (INDEPENDENT_AMBULATORY_CARE_PROVIDER_SITE_OTHER): Payer: Medicare HMO | Admitting: Physician Assistant

## 2023-06-13 VITALS — BP 158/80 | HR 89 | Ht 61.0 in | Wt 134.3 lb

## 2023-06-13 DIAGNOSIS — Z1589 Genetic susceptibility to other disease: Secondary | ICD-10-CM | POA: Diagnosis not present

## 2023-06-13 DIAGNOSIS — E559 Vitamin D deficiency, unspecified: Secondary | ICD-10-CM

## 2023-06-13 DIAGNOSIS — E782 Mixed hyperlipidemia: Secondary | ICD-10-CM | POA: Diagnosis not present

## 2023-06-13 DIAGNOSIS — I10 Essential (primary) hypertension: Secondary | ICD-10-CM

## 2023-06-13 MED ORDER — LISINOPRIL 10 MG PO TABS: 10.0000 mg | ORAL_TABLET | Freq: Every day | ORAL | 3 refills | Status: DC

## 2023-06-13 NOTE — Progress Notes (Unsigned)
   Established Patient Office Visit  Subjective   Patient ID: Theresa Herrera, female    DOB: 1950-10-21  Age: 72 y.o. MRN: [id number]  Chief Complaint  Patient presents with   Research Consent     Pt would like to discuss  health research     HPI 72 yo female presents today to discuss email from [facility] health on research trial. Patient qualified due to positive MTHFR gene status. The email included other labs they are going to test for including COMT, MTRR, and cystathionine B synthase. She was wondering if she could go ahead and get these labs.   Patient had a positive Cologuard in the past, but sent in another sample which was normal and she was told she no longer needed to get a colonoscopy.  Patient had elevated BP today. She reports no symptoms and feels well today. She is not taking at home BPs. She does report that she is experiencing increased stress and her diet has worsened. Denies CP and SOB.    Review of Systems  Respiratory:  Negative for shortness of breath.   Cardiovascular:  Negative for chest pain.  Gastrointestinal:  Negative for blood in stool, constipation and diarrhea.  Genitourinary:  Negative for dysuria, frequency and urgency.      Objective:     BP (!) 158/80 (BP Location: Right Arm)   Pulse 89   Ht 5\' 1"  (1.549 m)   Wt 60.9 kg   SpO2 100%   BMI 25.38 kg/m  BP Readings from Last 3 Encounters:  06/13/23 (!) 158/80  09/06/22 (!) 160/72  01/18/22 (!) 147/60      Physical Exam Cardiovascular:     Rate and Rhythm: Normal rate and regular rhythm.     Heart sounds: Normal heart sounds.     The 10-year ASCVD risk score (Arnett DK, et al., 2019) is: 21.6%    Assessment & Plan:   Problem List Items Addressed This Visit       Cardiovascular and Mediastinum   Essential hypertension, benign - Primary   Relevant Medications   lisinopril (ZESTRIL) 10 MG tablet   Other Relevant Orders   Homocysteine   CMP14+EGFR   TSH + free T4   VITAMIN D 25  Hydroxy (Vit-D Deficiency, Fractures)   Catechol Methyltransferase   White coat syndrome with hypertension   Relevant Medications   lisinopril (ZESTRIL) 10 MG tablet     Other   Hyperlipidemia   Relevant Medications   lisinopril (ZESTRIL) 10 MG tablet   Other Relevant Orders   Lipid panel   Vitamin D deficiency   Other Visit Diagnoses     MTHFR gene mutation       Relevant Orders   Homocysteine   CMP14+EGFR   TSH + free T4   VITAMIN D 25 Hydroxy (Vit-D Deficiency, Fractures)   Lipid panel   Catechol Methyltransferase      BP not to goal on Lisinopril and metoprolol. Increased lisinopril to 10 mg. Encouraged taking at home BPs. Return in 1 mo for nurse BP visit.  On statin.   Return in about 4 weeks (around 07/11/2023) for nurse BP recheck.    AnnaCollin M Mace, Student-PA

## 2023-06-13 NOTE — Patient Instructions (Signed)
Increase lisinopril to 10mg  daily.  Recheck nurse visit in 1 month.

## 2023-06-14 ENCOUNTER — Encounter: Payer: Self-pay | Admitting: Physician Assistant

## 2023-06-14 MED ORDER — ROSUVASTATIN CALCIUM 5 MG PO TABS: 5.0000 mg | ORAL_TABLET | Freq: Every day | ORAL | 3 refills | Status: DC

## 2023-06-14 NOTE — Addendum Note (Signed)
Addended by: Jomarie Longs on: 06/14/2023 01:44 PM   Modules accepted: Orders

## 2023-06-14 NOTE — Progress Notes (Signed)
Theresa Herrera,   Some labs pending.   Are you taking lipitor? Cholesterol is not to goal.   10 year risk of cardiovascular event is elevated at 21.2 percent.   Marland Kitchen.The 10-year ASCVD risk score (Arnett DK, et al., 2019) is: 21.2%   Values used to calculate the score:     Age: 72 years     Sex: Female     Is Non-Hispanic African American: No     Diabetic: No     Tobacco smoker: No     Systolic Blood Pressure: 158 mmHg     Is BP treated: Yes     HDL Cholesterol: 70 mg/dL     Total Cholesterol: 261 mg/dL

## 2023-06-16 ENCOUNTER — Encounter: Payer: Self-pay | Admitting: Physician Assistant

## 2023-06-21 ENCOUNTER — Encounter: Payer: Self-pay | Admitting: Physician Assistant

## 2023-07-04 ENCOUNTER — Encounter: Payer: Self-pay | Admitting: Physician Assistant

## 2023-07-07 NOTE — Telephone Encounter (Signed)
Called and spoke with [facility] representative States the test is a send out test ( to separate laboratory)  Theresa Herrera - researched this and the test code ordered was a [place] only ordered test code  Their system will not allow any results unless a [place] provider.  No other test codes for this are orderable  She is going to do some more research and reach out to [place] regarding what if anything can be done.

## 2023-07-08 ENCOUNTER — Telehealth: Payer: Self-pay

## 2023-07-08 NOTE — Telephone Encounter (Signed)
Attempted call to patient left a voice mail message requesting a return call.

## 2023-07-08 NOTE — Telephone Encounter (Signed)
Copied from CRM [id number]. Topic: Clinical - Lab/Test Results >> Jul 08, 2023  3:10 PM Nila Nephew wrote: Reason for CRM: Patient returning call to [facility]. Patient informed and stated will not be doing genetic testing through [facility]. Patient declined to have message sent back for call from [place] when available.

## 2023-07-08 NOTE — Telephone Encounter (Signed)
Again attempted call to patient. Left a voice mail message requesting a return call.

## 2023-07-11 ENCOUNTER — Ambulatory Visit: Payer: Medicare HMO

## 2023-07-11 ENCOUNTER — Encounter: Payer: Self-pay | Admitting: Physician Assistant

## 2023-07-22 LAB — CMP14+EGFR
ALT: 29 [IU]/L (ref 0–32)
AST: 26 [IU]/L (ref 0–40)
Albumin: 4.5 g/dL (ref 3.8–4.8)
Alkaline Phosphatase: 112 [IU]/L (ref 44–121)
BUN/Creatinine Ratio: 16 (ref 12–28)
BUN: 11 mg/dL (ref 8–27)
Bilirubin Total: 0.4 mg/dL (ref 0.0–1.2)
CO2: 23 mmol/L (ref 20–29)
Calcium: 9.7 mg/dL (ref 8.7–10.3)
Chloride: 101 mmol/L (ref 96–106)
Creatinine, Ser: 0.7 mg/dL (ref 0.57–1.00)
Globulin, Total: 2.1 g/dL (ref 1.5–4.5)
Glucose: 83 mg/dL (ref 70–99)
Potassium: 4 mmol/L (ref 3.5–5.2)
Sodium: 140 mmol/L (ref 134–144)
Total Protein: 6.6 g/dL (ref 6.0–8.5)
eGFR: 92 mL/min/{1.73_m2} (ref >59)

## 2023-07-22 LAB — LIPID PANEL
Chol/HDL Ratio: 3.7 {ratio} (ref 0.0–4.4)
Cholesterol, Total: 261 mg/dL — ABNORMAL HIGH (ref 100–199)
HDL: 70 mg/dL (ref >39)
LDL Chol Calc (NIH): 177 mg/dL — ABNORMAL HIGH (ref 0–99)
Triglycerides: 82 mg/dL (ref 0–149)
VLDL Cholesterol Cal: 14 mg/dL (ref 5–40)

## 2023-07-22 LAB — VITAMIN D 25 HYDROXY (VIT D DEFICIENCY, FRACTURES): Vit D, 25-Hydroxy: 48 ng/mL (ref 30.0–100.0)

## 2023-07-22 LAB — CATECHOL METHYLTRANSFERASE

## 2023-07-22 LAB — TSH+FREE T4
Free T4: 1.14 ng/dL (ref 0.82–1.77)
TSH: 1.38 u[IU]/mL (ref 0.450–4.500)

## 2023-07-22 LAB — HOMOCYSTEINE: Homocysteine: 10 umol/L (ref 0.0–19.2)

## 2023-08-05 NOTE — Telephone Encounter (Signed)
Spoke with patient. She states she printed out information about the genetic testing ( 20 pages )  She is researching and thinking about this . She will let Tandy Gaw, PA know what she decides.

## 2023-08-25 ENCOUNTER — Other Ambulatory Visit: Payer: Self-pay | Admitting: Physician Assistant

## 2023-08-25 DIAGNOSIS — R002 Palpitations: Secondary | ICD-10-CM

## 2023-08-25 DIAGNOSIS — I10 Essential (primary) hypertension: Secondary | ICD-10-CM

## 2023-09-06 ENCOUNTER — Other Ambulatory Visit (HOSPITAL_BASED_OUTPATIENT_CLINIC_OR_DEPARTMENT_OTHER): Payer: Self-pay | Admitting: Physician Assistant

## 2023-09-06 DIAGNOSIS — Z1231 Encounter for screening mammogram for malignant neoplasm of breast: Secondary | ICD-10-CM

## 2023-09-15 ENCOUNTER — Inpatient Hospital Stay (HOSPITAL_BASED_OUTPATIENT_CLINIC_OR_DEPARTMENT_OTHER): Admission: RE | Admit: 2023-09-15 | Payer: Medicare HMO | Source: Ambulatory Visit | Admitting: Radiology

## 2023-10-05 ENCOUNTER — Encounter (INDEPENDENT_AMBULATORY_CARE_PROVIDER_SITE_OTHER)

## 2023-10-05 DIAGNOSIS — Z1231 Encounter for screening mammogram for malignant neoplasm of breast: Secondary | ICD-10-CM | POA: Diagnosis not present

## 2023-10-06 ENCOUNTER — Encounter: Payer: Self-pay | Admitting: Physician Assistant

## 2023-10-06 NOTE — Progress Notes (Signed)
 Normal mammogram. Follow up in 1 year.

## 2023-12-29 ENCOUNTER — Ambulatory Visit

## 2023-12-29 VITALS — Ht 61.5 in | Wt 131.0 lb

## 2023-12-29 DIAGNOSIS — Z780 Asymptomatic menopausal state: Secondary | ICD-10-CM | POA: Diagnosis not present

## 2023-12-29 DIAGNOSIS — Z0000 Encounter for general adult medical examination without abnormal findings: Secondary | ICD-10-CM | POA: Diagnosis not present

## 2023-12-29 DIAGNOSIS — Z13820 Encounter for screening for osteoporosis: Secondary | ICD-10-CM

## 2023-12-29 NOTE — Patient Instructions (Signed)
  Ms. Danziger , Thank you for taking time to come for your Medicare Wellness Visit. I appreciate your ongoing commitment to your health goals. Please review the following plan we discussed and let me know if I can assist you in the future.   These are the goals we discussed:  Goals       Patient Stated (pt-stated)      12/11/2020 AWV Goal: Exercise for General Health  Patient will verbalize understanding of the benefits of increased physical activity: Exercising regularly is important. It will improve your overall fitness, flexibility, and endurance. Regular exercise also will improve your overall health. It can help you control your weight, reduce stress, and improve your bone density. Over the next year, patient will increase physical activity as tolerated with a goal of at least 150 minutes of moderate physical activity per week.  You can tell that you are exercising at a moderate intensity if your heart starts beating faster and you start breathing faster but can still hold a conversation. Moderate-intensity exercise ideas include: Walking 1 mile (1.6 km) in about 15 minutes Biking Hiking Golfing Dancing Water aerobics Patient will verbalize understanding of everyday activities that increase physical activity by providing examples like the following: Yard work, such as: Insurance underwriter Gardening Washing windows or floors Patient will be able to explain general safety guidelines for exercising:  Before you start a new exercise program, talk with your health care provider. Do not exercise so much that you hurt yourself, feel dizzy, or get very short of breath. Wear comfortable clothes and wear shoes with good support. Drink plenty of water while you exercise to prevent dehydration or heat stroke. Work out until your breathing and your heartbeat get faster.       Patient Stated (pt-stated)      She  would like to have more strength training, flexibility and good balance.      Patient Stated (pt-stated)      Patient stated that she would like to make her health a top priority.       Patient Stated      Patient states she would like to continue healthy lifestyle.         This is a list of the screening recommended for you and due dates:  Health Maintenance  Topic Date Due   Pneumococcal Vaccine for age over 37 (1 of 1 - PCV) Never done   Flu Shot  02/10/2024   Cologuard (Stool DNA test)  07/31/2024   Medicare Annual Wellness Visit  12/28/2024   Mammogram  10/04/2025   DTaP/Tdap/Td vaccine (2 - Td or Tdap) 10/05/2027   DEXA scan (bone density measurement)  Completed   Hepatitis C Screening  Completed   Zoster (Shingles) Vaccine  Completed   HPV Vaccine  Aged Out   Meningitis B Vaccine  Aged Out   COVID-19 Vaccine  Discontinued

## 2023-12-29 NOTE — Progress Notes (Signed)
 Subjective:   Theresa Herrera is a 73 y.o. female who presents for Medicare Annual (Subsequent) preventive examination.  Visit Complete: Virtual I connected with  Theresa Herrera on 12/29/23 by a audio enabled telemedicine application and verified that I am speaking with the correct person using two identifiers.  Patient Location: Home  Provider Location: Office/Clinic  I discussed the limitations of evaluation and management by telemedicine. The patient expressed understanding and agreed to proceed.  Vital Signs: Because this visit was a virtual/telehealth visit, some criteria may be missing or patient reported. Any vitals not documented were not able to be obtained and vitals that have been documented are patient reported.  Patient Medicare AWV questionnaire was completed by the patient on 12/28/2023; I have confirmed that all information answered by patient is correct and no changes since this date.  Cardiac Risk Factors include: advanced age (>31men, >15 women);family history of premature cardiovascular disease;hypertension;dyslipidemia     Objective:    Today's Vitals   12/29/23 0853  Weight: 131 lb (59.4 kg)  Height: 5' 1.5 (1.562 m)   Body mass index is 24.35 kg/m.     12/29/2023    9:07 AM 12/20/2022    9:08 AM 12/14/2021    9:08 AM 12/11/2020    3:13 PM 09/24/2015    9:31 AM  Advanced Directives  Does Patient Have a Medical Advance Directive? Yes Yes Yes Yes No   Type of Estate agent of [place];Living will Living will Living will Healthcare Power of [place];Living will   Does patient want to make changes to medical advance directive? No - Patient declined No - Patient declined No - Patient declined No - Patient declined   Copy of Healthcare Power of Attorney in Chart? No - copy requested   No - copy requested   Would patient like information on creating a medical advance directive?     No - patient declined information      Data saved with a previous  flowsheet row definition    Current Medications (verified) Outpatient Encounter Medications as of 12/29/2023  Medication Sig   AMBULATORY NON FORMULARY MEDICATION Vitamin D  green drink  Liposomal liquid vitamins   lisinopril  (ZESTRIL ) 10 MG tablet Take 1 tablet (10 mg total) by mouth daily.   metoprolol  succinate (TOPROL -XL) 25 MG 24 hr tablet TAKE 1 TABLET (25 MG TOTAL) BY MOUTH DAILY.   rosuvastatin  (CRESTOR ) 5 MG tablet Take 1 tablet (5 mg total) by mouth daily.   aspirin 81 MG tablet Take 81 mg by mouth daily. Doesn't take it everyday (Patient not taking: Reported on 12/29/2023)   atorvastatin  (LIPITOR) 40 MG tablet Take 1 tablet (40 mg total) by mouth daily. (Patient not taking: Reported on 12/29/2023)   UNABLE TO FIND Med Name: Waymon Hail - liposomal bone health liquid drink (Patient not taking: Reported on 12/29/2023)   No facility-administered encounter medications on file as of 12/29/2023.    Allergies (verified) Doxycycline  and Moderna covid-19 vaccine [covid-19 mrna vacc (moderna)]   History: Past Medical History:  Diagnosis Date   Hyperlipidemia    Hypertension    Past Surgical History:  Procedure Laterality Date   TONSILLECTOMY  1958   TUBAL LIGATION     Family History  Problem Relation Age of Onset   Hypertension Father    Hyperlipidemia Father    Heart attack Father    Cancer Mother    Hyperlipidemia Mother    Hypertension Mother    Social History   Socioeconomic History  Marital status: Married    Spouse name: Myrtie Atkinson   Number of children: 1   Years of education: 12   Highest education level: 12th grade  Occupational History    Comment: Retired  Tobacco Use   Smoking status: Never   Smokeless tobacco: Never  Vaping Use   Vaping status: Never Used  Substance and Sexual Activity   Alcohol use: No   Drug use: Not Currently   Sexual activity: Never  Other Topics Concern   Not on file  Social History Narrative   Lives with her husband. She has one son who  lives with [place]. She enjoys yardwork, music, playing piano and decorating.   Social Drivers of Corporate investment banker Strain: Low Risk  (12/29/2023)   Overall Financial Resource Strain (CARDIA)    Difficulty of Paying Living Expenses: Not hard at all  Food Insecurity: No Food Insecurity (12/29/2023)   Hunger Vital Sign    Worried About Running Out of Food in the Last Year: Never true    Ran Out of Food in the Last Year: Never true  Recent Concern: Food Insecurity - Food Insecurity Present (12/28/2023)   Hunger Vital Sign    Worried About Running Out of Food in the Last Year: Sometimes true    Ran Out of Food in the Last Year: Sometimes true  Transportation Needs: No Transportation Needs (12/29/2023)   PRAPARE - Administrator, Civil Service (Medical): No    Lack of Transportation (Non-Medical): No  Physical Activity: Sufficiently Active (12/29/2023)   Exercise Vital Sign    Days of Exercise per Week: 5 days    Minutes of Exercise per Session: 40 min  Stress: No Stress Concern Present (12/29/2023)   [employer] of Occupational Health - Occupational Stress Questionnaire    Feeling of Stress: Not at all  Social Connections: Socially Integrated (12/29/2023)   Social Connection and Isolation Panel    Frequency of Communication with Friends and Family: More than three times a week    Frequency of Social Gatherings with Friends and Family: Twice a week    Attends Religious Services: More than 4 times per year    Active Member of [employer] or Organizations: Yes    Attends Engineer, structural: More than 4 times per year    Marital Status: Married    Tobacco Counseling Counseling given: Not Answered   Clinical Intake:  Pre-visit preparation completed: Yes  Pain : No/denies pain     BMI - recorded: 24.35 Nutritional Status: BMI of 19-24  Normal Nutritional Risks: None Diabetes: No  How often do you need to have someone help you when you read  instructions, pamphlets, or other written materials from your doctor or pharmacy?: 1 - Never What is the last grade level you completed in school?: 12  Interpreter Needed?: No      Activities of Daily Living    12/29/2023    8:54 AM 12/28/2023    1:50 PM  In your present state of health, do you have any difficulty performing the following activities:  Hearing? 0 0  Vision? 0 0  Difficulty concentrating or making decisions? 0 0  Walking or climbing stairs? 0 0  Dressing or bathing? 0 0  Doing errands, shopping? 0 0  Preparing Food and eating ? N N  Using the Toilet? N N  In the past six months, have you accidently leaked urine? N N  Do you have problems with loss  of bowel control? N N  Managing your Medications? N N  Managing your Finances? N N  Housekeeping or managing your Housekeeping? N N    Patient Care Team: Breeback, Jade L, PA-C as PCP - General (Family Medicine)  Indicate any recent Medical Services you may have received from other than [facility] providers in the past year (date may be approximate).     Assessment:   This is a routine wellness examination for Theresa Herrera.  Hearing/Vision screen No results found.   Goals Addressed             This Visit's Progress    Patient Stated       Patient states she would like to continue healthy lifestyle.        Depression Screen    12/29/2023    9:05 AM 06/13/2023   10:16 AM 12/20/2022    9:08 AM 09/06/2022    9:18 AM 01/18/2022    8:02 AM 12/14/2021    9:08 AM 07/20/2021    8:12 AM  PHQ 2/9 Scores  PHQ - 2 Score 0 0 0 0 0 0 0    Fall Risk    12/29/2023    9:07 AM 12/28/2023    1:50 PM 06/13/2023   10:16 AM 12/20/2022    9:08 AM 09/06/2022    9:18 AM  Fall Risk   Falls in the past year? 0 0 0 0 0  Number falls in past yr: 0 0 0 0 0  Injury with Fall? 0 0 0 0 0  Risk for fall due to : No Fall Risks   No Fall Risks No Fall Risks  Follow up Falls evaluation completed   Falls evaluation completed;Education provided  Falls evaluation completed    MEDICARE RISK AT HOME: Medicare Risk at Home Any stairs in or around the home?: Yes If so, are there any without handrails?: No Home free of loose throw rugs in walkways, pet beds, electrical cords, etc?: Yes Adequate lighting in your home to reduce risk of falls?: Yes Life alert?: No Use of a cane, walker or w/c?: No Grab bars in the bathroom?: Yes Shower chair or bench in shower?: No Elevated toilet seat or a handicapped toilet?: No  TIMED UP AND GO:  Was the test performed?  No    Cognitive Function:        12/29/2023    9:08 AM 12/20/2022    9:14 AM 12/14/2021    9:16 AM 12/11/2020    3:25 PM  6CIT Screen  What Year? 0 points 0 points 0 points 0 points  What month? 0 points 0 points 0 points 0 points  What time? 0 points 0 points 0 points 0 points  Count back from 20 0 points 0 points 2 points 0 points  Months in reverse 0 points 0 points 0 points 0 points  Repeat phrase 0 points 0 points 0 points 6 points  Total Score 0 points 0 points 2 points 6 points    Immunizations Immunization History  Administered Date(s) Administered   Moderna Sars-Covid-2 Vaccination 07/31/2020   Tdap 10/04/2017   Zoster Recombinant(Shingrix) 03/14/2018, 05/15/2018    TDAP status: Up to date  Flu Vaccine status: Declined, Education has been provided regarding the importance of this vaccine but patient still declined. Advised may receive this vaccine at local pharmacy or Health Dept. Aware to provide a copy of the vaccination record if obtained from local pharmacy or Health Dept. Verbalized acceptance and understanding.  Pneumococcal vaccine status: Declined,  Education has been provided regarding the importance of this vaccine but patient still declined. Advised may receive this vaccine at local pharmacy or Health Dept. Aware to provide a copy of the vaccination record if obtained from local pharmacy or Health Dept. Verbalized acceptance and understanding.    Covid-19 vaccine status: Declined, Education has been provided regarding the importance of this vaccine but patient still declined. Advised may receive this vaccine at local pharmacy or Health Dept.or vaccine clinic. Aware to provide a copy of the vaccination record if obtained from local pharmacy or Health Dept. Verbalized acceptance and understanding.  Qualifies for Shingles Vaccine? Yes   Zostavax completed No   Shingrix Completed?: Yes  Screening Tests Health Maintenance  Topic Date Due   Pneumococcal Vaccine: 50+ Years (1 of 1 - PCV) Never done   INFLUENZA VACCINE  02/10/2024   Fecal DNA (Cologuard)  07/31/2024   Medicare Annual Wellness (AWV)  12/28/2024   MAMMOGRAM  10/04/2025   DTaP/Tdap/Td (2 - Td or Tdap) 10/05/2027   DEXA SCAN  Completed   Hepatitis C Screening  Completed   Zoster Vaccines- Shingrix  Completed   HPV VACCINES  Aged Out   Meningococcal B Vaccine  Aged Out   COVID-19 Vaccine  Discontinued    Health Maintenance  Health Maintenance Due  Topic Date Due   Pneumococcal Vaccine: 50+ Years (1 of 1 - PCV) Never done    Colorectal cancer screening: Type of screening: Cologuard. Completed 07/31/2021. Repeat every 3 years It was positive. She needs a colonoscopy. She will think about it and call us  back.   Mammogram status: Completed 10/05/2023. Repeat every year  Bone Density status: Ordered 12/29/2023. Pt provided with contact info and advised to call to schedule appt.  Lung Cancer Screening: (Low Dose CT Chest recommended if Age 53-80 years, 20 pack-year currently smoking OR have quit w/in 15years.) does not qualify.   Lung Cancer Screening Referral: n/a  Additional Screening:  Hepatitis C Screening: does qualify; Completed 11/25/2015  Vision Screening: Recommended annual ophthalmology exams for early detection of glaucoma and other disorders of the eye. Is the patient up to date with their annual eye exam?  Yes  Who is the provider or what is the name  of the office in which the patient attends annual eye exams? Dr April Knack If pt is not established with a provider, would they like to be referred to a provider to establish care? N/a.   Dental Screening: Recommended annual dental exams for proper oral hygiene   Community Resource Referral / Chronic Care Management: CRR required this visit?  No   CCM required this visit?  No     Plan:     I have personally reviewed and noted the following in the patient's chart:   Medical and social history Use of alcohol, tobacco or illicit drugs  Current medications and supplements including opioid prescriptions. Patient is not currently taking opioid prescriptions. Functional ability and status Nutritional status Physical activity Advanced directives List of other physicians Hospitalizations, surgeries, and ER visits in previous 12 months. None Vitals Screenings to include cognitive, depression, and falls Referrals and appointments  In addition, I have reviewed and discussed with patient certain preventive protocols, quality metrics, and best practice recommendations. A written personalized care plan for preventive services as well as general preventive health recommendations were provided to patient.     Aubrey Leaf, CMA   12/29/2023   After Visit Summary: (MyChart) Due to  this being a telephonic visit, the after visit summary with patients personalized plan was offered to patient via MyChart   Nurse Notes:

## 2024-02-01 ENCOUNTER — Ambulatory Visit

## 2024-02-01 ENCOUNTER — Ambulatory Visit: Payer: Self-pay | Admitting: Physician Assistant

## 2024-02-01 DIAGNOSIS — Z13820 Encounter for screening for osteoporosis: Secondary | ICD-10-CM

## 2024-02-01 NOTE — Progress Notes (Signed)
 You have osteoporosis that is worsening more in some areas than others. Can we schedule appt to discuss this?

## 2024-02-19 ENCOUNTER — Other Ambulatory Visit: Payer: Self-pay | Admitting: Physician Assistant

## 2024-02-19 DIAGNOSIS — R002 Palpitations: Secondary | ICD-10-CM

## 2024-02-19 DIAGNOSIS — I10 Essential (primary) hypertension: Secondary | ICD-10-CM

## 2024-02-28 ENCOUNTER — Other Ambulatory Visit (HOSPITAL_COMMUNITY): Payer: Self-pay

## 2024-02-28 ENCOUNTER — Ambulatory Visit (INDEPENDENT_AMBULATORY_CARE_PROVIDER_SITE_OTHER): Admitting: Physician Assistant

## 2024-02-28 ENCOUNTER — Encounter: Payer: Self-pay | Admitting: Physician Assistant

## 2024-02-28 VITALS — BP 143/81 | HR 87 | Ht 61.0 in | Wt 133.0 lb

## 2024-02-28 DIAGNOSIS — E782 Mixed hyperlipidemia: Secondary | ICD-10-CM | POA: Diagnosis not present

## 2024-02-28 DIAGNOSIS — M810 Age-related osteoporosis without current pathological fracture: Secondary | ICD-10-CM

## 2024-02-28 DIAGNOSIS — R002 Palpitations: Secondary | ICD-10-CM

## 2024-02-28 DIAGNOSIS — I10 Essential (primary) hypertension: Secondary | ICD-10-CM | POA: Diagnosis not present

## 2024-02-28 MED ORDER — METOPROLOL SUCCINATE ER 25 MG PO TB24: 25.0000 mg | ORAL_TABLET | Freq: Every day | ORAL | 1 refills | Status: DC

## 2024-02-28 MED ORDER — METOPROLOL SUCCINATE ER 25 MG PO TB24
25.0000 mg | ORAL_TABLET | Freq: Every day | ORAL | 1 refills | Status: AC
  Filled 2024-02-28 – 2024-03-02 (×2): qty 90, 90d supply, fill #0

## 2024-02-28 MED ORDER — ATORVASTATIN CALCIUM 40 MG PO TABS
40.0000 mg | ORAL_TABLET | Freq: Every day | ORAL | 3 refills | Status: DC
  Filled 2024-02-28: qty 90, 90d supply, fill #0

## 2024-02-28 MED ORDER — ATORVASTATIN CALCIUM 40 MG PO TABS
40.0000 mg | ORAL_TABLET | Freq: Every day | ORAL | 3 refills | Status: DC
Start: 2024-02-28 — End: 2024-02-28

## 2024-02-28 MED ORDER — LISINOPRIL 10 MG PO TABS: 10.0000 mg | ORAL_TABLET | Freq: Every day | ORAL | 1 refills | Status: DC

## 2024-02-28 MED ORDER — LISINOPRIL 10 MG PO TABS
10.0000 mg | ORAL_TABLET | Freq: Every day | ORAL | 1 refills | Status: AC
  Filled 2024-02-28 – 2024-03-02 (×2): qty 90, 90d supply, fill #0

## 2024-02-28 NOTE — Patient Instructions (Signed)
 [facility] [place] Address: [address] [place], [place] [address] Phone: [phone]  Weak, Fragile Bones (Osteoporosis): What to Know  Osteoporosis is when the bones become thin and less dense than normal. Osteoporosis makes bones more fragile and more likely to break. Over time, the condition can cause your bones to become so weak that they break, or fracture, after a minor fall. Bones in the hip, wrist, and spine are most likely to break. What are the causes? The exact cause of this condition is not known. What increases the risk? Having family members with this condition. Taking: Steroid medicines. Anti-seizure medicines. Being female. Being 24 years old or older. Being of European decent. Smoking or using other products that contain nicotine or tobacco. Not exercising. What are the signs or symptoms? A broken bone might be the first sign, especially if the break results from a fall or injury that usually would not cause a bone to break. Other signs and symptoms include: Pain in the neck or low back. Being hunched over. Getting shorter. How is this diagnosed? This condition may be diagnosed based on: Your medical history. A physical exam. A bone mineral density test, also called a DXA or DEXA test. This test uses X-rays to measure how dense your bones are. How is this treated? This condition may be treated with medicines and supplements, including: Taking medicines to slow bone loss or help make the bones stronger. Taking calcium  and vitamin D  supplements every day. Taking hormone replacement medicines, such as estrogen for female and testosterone for males. It may also be treated by: Quitting smoking or using tobacco products. Doing exercises. Limiting how much alcohol you drink. Eating more foods with calcium  and vitamin D  in them. Monitoring your blood levels of calcium  and vitamin D . The goal of treatment is to strengthen your bones and lower your risk for a  bone break. Follow these instructions at home: Eating and drinking Eat plenty of food with calcium  and vitamin D  in them. This may include: Some fish, such as salmon and tuna. Foods that have calcium  and vitamin D  added to them, such as some cereals. Egg yolks. Cheese. Liver.  Activity Exercise as told. Ask what things are safe for you to do. You may be told to: Do exercises that make your muscles work to hold your body weight up, such as tai chi, yoga, or walking. These are called weight-bearing exercises. Do exercises to make your muscles stronger, such as lifting weights. These are called muscle-strengthening exercises. Lifestyle Do not drink alcohol if your health care provider tells you not to drink. Your health care provider tells you not to drink. You are pregnant, may be pregnant, or are planning to become pregnant. If you drink alcohol: Limit how much you have to: 0-1 drink a day if you're female. 0-2 drinks a day if you're female. Know how much alcohol is in your drink. In the [place] one drink is one 12 oz bottle of beer (355 mL), one 5 oz glass of wine (148 mL), or one 1 oz glass of hard liquor (44 mL). Do not smoke, vape, or use nicotine or tobacco. Preventing falls Use a cane, walker, scooter, or crutches to help you move around if needed. Keep rooms well-lit and get rid of clutter. Put away things on the floor that could make you trip, such as cords and rugs. Put grab bars in bathrooms and safety rails on stairs. Use rubber mats in slippery areas, like bathrooms. Wear  shoes that: Fit you well. Support your feet. Have closed toes. Have rubber soles or low heels. Talk to your provider about all of the medicines you take. Some medicines can make you more likely to fall because they can cause dizziness or changes in blood pressure. General instructions Take your medicines only as told. Keep all follow-up visits. Your provider may want to repeat tests. Where to find more  information Bone Health and Osteoporosis Foundation: bonehealthandosteoporosis.org Contact a health care provider if: You have never been screened for osteoporosis and you are: A female who is 72 years old or older. A female who is age 72 years old or older. Get help right away if: You fall. You get hurt. This information is not intended to replace advice given to you by your health care provider. Make sure you discuss any questions you have with your health care provider. Document Revised: 05/10/2023 Document Reviewed: 01/14/2023 Elsevier Patient Education  2024 Elsevier Inc.  Denosumab Injection (Osteoporosis) What is this medication? DENOSUMAB (den oh SUE mab) prevents and treats osteoporosis. It works by Interior and spatial designer stronger and less likely to break (fracture). It is a monoclonal antibody. This medicine may be used for other purposes; ask your health care provider or pharmacist if you have questions. COMMON BRAND NAME(S): Prolia What should I tell my care team before I take this medication? They need to know if you have any of these conditions: Dental or gum disease Had thyroid  or parathyroid (glands located in neck) surgery Having dental surgery or a tooth pulled Kidney disease Low levels of calcium  in the blood On dialysis Poor nutrition Thyroid  disease Trouble absorbing nutrients from your food An unusual or allergic reaction to denosumab, other medications, foods, dyes, or preservatives Pregnant or trying to get pregnant Breastfeeding How should I use this medication? This medication is injected under the skin. It is given by your care team in a hospital or clinic setting. A special MedGuide will be given to you before each treatment. Be sure to read this information carefully each time. Talk to your care team about the use of this medication in children. Special care may be needed. Overdosage: If you think you have taken too much of this medicine contact a poison  control center or emergency room at once. NOTE: This medicine is only for you. Do not share this medicine with others. What if I miss a dose? Keep appointments for follow-up doses. It is important not to miss your dose. Call your care team if you are unable to keep an appointment. What may interact with this medication? Do not take this medication with any of the following: Other medications that contain denosumab This medication may also interact with the following: Medications that lower your chance of fighting infection Steroid medications, such as prednisone or cortisone This list may not describe all possible interactions. Give your health care provider a list of all the medicines, herbs, non-prescription drugs, or dietary supplements you use. Also tell them if you smoke, drink alcohol, or use illegal drugs. Some items may interact with your medicine. What should I watch for while using this medication? Your condition will be monitored carefully while you are receiving this medication. You may need blood work done while taking this medication. This medication may increase your risk of getting an infection. Call your care team for advice if you get a fever, chills, sore throat, or other symptoms of a cold or flu. Do not treat yourself. Try to avoid being  around people who are sick. Tell your dentist and dental surgeon that you are taking this medication. You should not have major dental surgery while on this medication. See your dentist to have a dental exam and fix any dental problems before starting this medication. Take good care of your teeth while on this medication. Make sure you see your dentist for regular follow-up appointments. This medication may cause low levels of calcium  in your body. The risk of severe side effects is increased in people with kidney disease. Your care team may prescribe calcium  and vitamin D  to help prevent low calcium  levels while you take this medication. It is  important to take calcium  and vitamin D  as directed by your care team. Talk to your care team if you may be pregnant. Serious birth defects may occur if you take this medication during pregnancy and for 5 months after the last dose. You will need a negative pregnancy test before starting this medication. Contraception is recommended while taking this medication and for 5 months after the last dose. Your care team can help you find the option that works for you. Talk to your care team before breastfeeding. Changes to your treatment plan may be needed. What side effects may I notice from receiving this medication? Side effects that you should report to your care team as soon as possible: Allergic reactions--skin rash, itching, hives, swelling of the face, lips, tongue, or throat Infection--fever, chills, cough, sore throat, wounds that don't heal, pain or trouble when passing urine, general feeling of discomfort or being unwell Low calcium  level--muscle pain or cramps, confusion, tingling, or numbness in the hands or feet Osteonecrosis of the jaw--pain, swelling, or redness in the mouth, numbness of the jaw, poor healing after dental work, unusual discharge from the mouth, visible bones in the mouth Severe bone, joint, or muscle pain Skin infection--skin redness, swelling, warmth, or pain Side effects that usually do not require medical attention (report these to your care team if they continue or are bothersome): Back pain Headache Joint pain Muscle pain Pain in the hands, arms, legs, or feet Runny or stuffy nose Sore throat This list may not describe all possible side effects. Call your doctor for medical advice about side effects. You may report side effects to FDA at 1-800-FDA-1088. Where should I keep my medication? This medication is given in a hospital or clinic. It will not be stored at home. NOTE: This sheet is a summary. It may not cover all possible information. If you have questions  about this medicine, talk to your doctor, pharmacist, or health care provider.  2024 Elsevier/Gold Standard (2022-08-03 00:00:00)  Alendronate Weekly Tablets What is this medication? ALENDRONATE (a LEN droe nate) treats osteoporosis. It works by Interior and spatial designer stronger and less likely to break (fracture). It belongs to a group of medications called bisphosphonates. This medicine may be used for other purposes; ask your health care provider or pharmacist if you have questions. COMMON BRAND NAME(S): Fosamax What should I tell my care team before I take this medication? They need to know if you have any of these conditions: Bleeding disorder Cancer Dental disease Difficulty swallowing Infection (fever, chills, cough, sore throat, pain or trouble passing urine) Kidney disease Low levels of calcium  or other minerals in the blood Low red blood cell counts Receiving steroids like dexamethasone or prednisone Stomach or intestine problems Trouble sitting or standing for 30 minutes An unusual or allergic reaction to alendronate, other medications, foods, dyes or preservatives  Pregnant or trying to get pregnant Breast-feeding How should I use this medication? Take this medication by mouth with a full glass of water. Take it as directed on the prescription label at the same day of each week. Take the dose right after waking up. Do not eat or drink anything before taking it. Do not take it with any other drink except water. Do not chew or crush the tablet. After taking it, do not eat breakfast, drink, or take any other medications or vitamins for at least 30 minutes. Sit or stand up for at least 30 minutes after you take it. Do not lie down. Keep taking it unless your care team tells you to stop. A special MedGuide will be given to you by the pharmacist with each prescription and refill. Be sure to read this information carefully each time. Talk to your care team about the use of this medication in  children. Special care may be needed. Overdosage: If you think you have taken too much of this medicine contact a poison control center or emergency room at once. NOTE: This medicine is only for you. Do not share this medicine with others. What if I miss a dose? If you take your medication once a day, skip it. Take your next dose at the scheduled time the next morning. Do not take two doses on the same day. If you take your medication once a week, take the missed dose on the morning after you remember. Do not take two doses on the same day. What may interact with this medication? Aluminum hydroxide Antacids Aspirin Calcium  supplements Iron supplements Medications for inflammation like ibuprofen, naproxen, and others Magnesium supplements Vitamins with minerals This list may not describe all possible interactions. Give your health care provider a list of all the medicines, herbs, non-prescription drugs, or dietary supplements you use. Also tell them if you smoke, drink alcohol, or use illegal drugs. Some items may interact with your medicine. What should I watch for while using this medication? Visit your care team for regular checks on your progress. It may be some time before you see the benefit from this medication. Some people who take this medication have severe bone, joint, or muscle pain. This medication may also increase your risk for jaw problems or a broken thigh bone. Tell your care team right away if you have severe pain in your jaw, bones, joints, or muscles. Tell you care team if you have any pain that does not go away or that gets worse. Tell your dentist and dental surgeon that you are taking this medication. You should not have major dental surgery while on this medication. See your dentist to have a dental exam and fix any dental problems before starting this medication. Take good care of your teeth while on this medication. Make sure you see your dentist for regular follow-up  appointments. You should make sure you get enough calcium  and vitamin D  while you are taking this medication. Discuss the foods you eat and the vitamins you take with your care team. You may need blood work done while you are taking this medication. What side effects may I notice from receiving this medication? Side effects that you should report to your care team as soon as possible: Allergic reactions--skin rash, itching, hives, swelling of the face, lips, tongue, or throat Low calcium  level--muscle pain or cramps, confusion, tingling, or numbness in the hands or feet Osteonecrosis of the jaw--pain, swelling, or redness in the mouth, numbness of  the jaw, poor healing after dental work, unusual discharge from the mouth, visible bones in the mouth Pain or trouble swallowing Severe bone, joint, or muscle pain Stomach bleeding--bloody or black, tar-like stools, vomiting blood or brown material that looks like coffee grounds Side effects that usually do not require medical attention (report to your care team if they continue or are bothersome): Constipation Diarrhea Nausea Stomach pain This list may not describe all possible side effects. Call your doctor for medical advice about side effects. You may report side effects to FDA at 1-800-FDA-1088. Where should I keep my medication? Keep out of the reach of children and pets. Store at room temperature between 15 and 30 degrees C (59 and 86 degrees F). Throw away any unused medication after the expiration date. NOTE: This sheet is a summary. It may not cover all possible information. If you have questions about this medicine, talk to your doctor, pharmacist, or health care provider.  2024 Elsevier/Gold Standard (2020-06-23 00:00:00)

## 2024-02-28 NOTE — Progress Notes (Unsigned)
   Established Patient Office Visit  Subjective   Patient ID: Theresa Herrera, female    DOB: Aug 22, 1950  Age: 73 y.o. MRN: [id number]  No chief complaint on file.   HPI  {History (Optional):23778}  ROS    Objective:     BP (!) 143/81   Pulse 87   Ht 5' 1 (1.549 m)   Wt 133 lb (60.3 kg)   SpO2 99%   BMI 25.13 kg/m  {Vitals History (Optional):23777}  Physical Exam   No results found for any visits on 02/28/24.  {Labs (Optional):23779}  The 10-year ASCVD risk score (Arnett DK, et al., 2019) is: 19.6%    Assessment & Plan:   Problem List Items Addressed This Visit       Unprioritized   Osteoporosis - Primary    No follow-ups on file.    Amarie Viles, PA-C

## 2024-02-29 ENCOUNTER — Ambulatory Visit: Payer: Self-pay | Admitting: Physician Assistant

## 2024-02-29 LAB — CMP14+EGFR
ALT: 25 IU/L (ref 0–32)
AST: 22 IU/L (ref 0–40)
Albumin: 4.7 g/dL (ref 3.8–4.8)
Alkaline Phosphatase: 104 IU/L (ref 44–121)
BUN/Creatinine Ratio: 20 (ref 12–28)
BUN: 14 mg/dL (ref 8–27)
Bilirubin Total: 0.3 mg/dL (ref 0.0–1.2)
CO2: 20 mmol/L (ref 20–29)
Calcium: 9.6 mg/dL (ref 8.7–10.3)
Chloride: 100 mmol/L (ref 96–106)
Creatinine, Ser: 0.7 mg/dL (ref 0.57–1.00)
Globulin, Total: 1.9 g/dL (ref 1.5–4.5)
Glucose: 87 mg/dL (ref 70–99)
Potassium: 4.1 mmol/L (ref 3.5–5.2)
Sodium: 136 mmol/L (ref 134–144)
Total Protein: 6.6 g/dL (ref 6.0–8.5)
eGFR: 92 mL/min/1.73 (ref >59)

## 2024-02-29 LAB — LIPID PANEL
Chol/HDL Ratio: 4.3 ratio (ref 0.0–4.4)
Cholesterol, Total: 243 mg/dL — ABNORMAL HIGH (ref 100–199)
HDL: 57 mg/dL (ref >39)
LDL Chol Calc (NIH): 170 mg/dL — ABNORMAL HIGH (ref 0–99)
Triglycerides: 92 mg/dL (ref 0–149)
VLDL Cholesterol Cal: 16 mg/dL (ref 5–40)

## 2024-02-29 NOTE — Progress Notes (Signed)
 Theresa Herrera,   Kidney and liver look good.  LDL not improved very much. You are not taking the lipitor, correct? Your 10 year cardiovascular risk is elevated and it is something I think you should consider.   [name].The 10-year ASCVD risk score (Arnett DK, et al., 2019) is: 19.7%   Values used to calculate the score:     Age: 73 years     Clincally relevant sex: Female     Is Non-Hispanic African American: No     Diabetic: No     Tobacco smoker: No     Systolic Blood Pressure: 143 mmHg     Is BP treated: Yes     HDL Cholesterol: 57 mg/dL     Total Cholesterol: 243 mg/dL

## 2024-03-02 ENCOUNTER — Other Ambulatory Visit (HOSPITAL_COMMUNITY): Payer: Self-pay

## 2024-03-26 DIAGNOSIS — D225 Melanocytic nevi of trunk: Secondary | ICD-10-CM | POA: Diagnosis not present

## 2024-03-26 DIAGNOSIS — L814 Other melanin hyperpigmentation: Secondary | ICD-10-CM | POA: Diagnosis not present

## 2024-03-26 DIAGNOSIS — L570 Actinic keratosis: Secondary | ICD-10-CM | POA: Diagnosis not present

## 2024-03-26 DIAGNOSIS — L821 Other seborrheic keratosis: Secondary | ICD-10-CM | POA: Diagnosis not present

## 2024-03-26 DIAGNOSIS — L578 Other skin changes due to chronic exposure to nonionizing radiation: Secondary | ICD-10-CM | POA: Diagnosis not present

## 2024-04-26 NOTE — Progress Notes (Signed)
 Theresa Herrera                                          MRN: [id number]   04/26/2024   The [facility] Quality Team Specialist reviewed this patient medical record for the purposes of chart review for care gap closure. The following were reviewed: chart review for care gap closure-controlling blood pressure.    [facility] Quality Team

## 2024-05-18 ENCOUNTER — Other Ambulatory Visit: Payer: Self-pay | Admitting: Physician Assistant

## 2024-05-19 ENCOUNTER — Other Ambulatory Visit: Payer: Self-pay | Admitting: Physician Assistant

## 2024-05-19 DIAGNOSIS — E782 Mixed hyperlipidemia: Secondary | ICD-10-CM

## 2024-05-23 MED ORDER — ATORVASTATIN CALCIUM 40 MG PO TABS: 40.0000 mg | ORAL_TABLET | Freq: Every day | ORAL | 3 refills | Status: AC

## 2024-05-25 NOTE — Progress Notes (Signed)
 Theresa Herrera                                          MRN: [id number]   05/25/2024   The [facility] Quality Team Specialist reviewed this patient medical record for the purposes of chart review for care gap closure. The following were reviewed: chart review for care gap closure-controlling blood pressure.    [facility] Quality Team

## 2024-06-21 NOTE — Progress Notes (Signed)
 Dedra Matsuo                                          MRN: [id number]   06/21/2024   The [facility] Quality Team Specialist reviewed this patient medical record for the purposes of chart review for care gap closure. The following were reviewed: chart review for care gap closure-controlling blood pressure.    [facility] Quality Team

## 2024-07-31 NOTE — Progress Notes (Signed)
 Theresa Herrera                                          MRN: [id number]   07/31/2024   The [facility] Quality Team Specialist reviewed this patient medical record for the purposes of chart review for care gap closure. The following were reviewed: chart review for care gap closure-glycemic status assessment.    [facility] Quality Team

## 2025-01-01 ENCOUNTER — Ambulatory Visit
